# Patient Record
Sex: Female | Born: 1960 | Race: White | Hispanic: No | Marital: Married | Smoking: Current every day smoker
Health system: Southern US, Community
[De-identification: ages and names within clinical notes are randomized; demographics above are authoritative.]

## PROBLEM LIST (undated history)

## (undated) DIAGNOSIS — F329 Major depressive disorder, single episode, unspecified: Secondary | ICD-10-CM

## (undated) DIAGNOSIS — E079 Disorder of thyroid, unspecified: Secondary | ICD-10-CM

## (undated) DIAGNOSIS — F419 Anxiety disorder, unspecified: Secondary | ICD-10-CM

## (undated) DIAGNOSIS — M2679 Other specified alveolar anomalies: Secondary | ICD-10-CM

## (undated) DIAGNOSIS — F32A Depression, unspecified: Secondary | ICD-10-CM

## (undated) HISTORY — PX: TONSILLECTOMY: SUR1361

## (undated) HISTORY — PX: REPLACEMENT TOTAL KNEE: SUR1224

## (undated) HISTORY — PX: ABDOMINAL HYSTERECTOMY: SHX81

---

## 2004-11-21 ENCOUNTER — Emergency Department: Payer: Self-pay | Admitting: Emergency Medicine

## 2004-12-04 ENCOUNTER — Ambulatory Visit: Payer: Self-pay | Admitting: Internal Medicine

## 2007-05-20 ENCOUNTER — Other Ambulatory Visit: Payer: Self-pay

## 2007-05-20 ENCOUNTER — Inpatient Hospital Stay: Payer: Self-pay | Admitting: Internal Medicine

## 2007-05-21 ENCOUNTER — Other Ambulatory Visit: Payer: Self-pay

## 2010-10-14 ENCOUNTER — Inpatient Hospital Stay: Payer: Self-pay | Admitting: Unknown Physician Specialty

## 2014-07-22 ENCOUNTER — Emergency Department: Payer: Self-pay | Admitting: Emergency Medicine

## 2016-07-17 ENCOUNTER — Ambulatory Visit: Payer: 59 | Admitting: Physical Therapy

## 2016-11-01 ENCOUNTER — Emergency Department
Admission: EM | Admit: 2016-11-01 | Discharge: 2016-11-01 | Disposition: A | Discharge status: Home / Self Care | Payer: BLUE CROSS/BLUE SHIELD | Attending: Emergency Medicine | Admitting: Emergency Medicine

## 2016-11-01 ENCOUNTER — Encounter: Payer: Self-pay | Admitting: Emergency Medicine

## 2016-11-01 DIAGNOSIS — K0889 Other specified disorders of teeth and supporting structures: Secondary | ICD-10-CM | POA: Insufficient documentation

## 2016-11-01 DIAGNOSIS — R6884 Jaw pain: Secondary | ICD-10-CM

## 2016-11-01 DIAGNOSIS — R001 Bradycardia, unspecified: Secondary | ICD-10-CM | POA: Insufficient documentation

## 2016-11-01 DIAGNOSIS — M542 Cervicalgia: Secondary | ICD-10-CM | POA: Diagnosis not present

## 2016-11-01 HISTORY — DX: Depression, unspecified: F32.A

## 2016-11-01 HISTORY — DX: Major depressive disorder, single episode, unspecified: F32.9

## 2016-11-01 HISTORY — DX: Other specified alveolar anomalies: M26.79

## 2016-11-01 HISTORY — DX: Disorder of thyroid, unspecified: E07.9

## 2016-11-01 LAB — BASIC METABOLIC PANEL
Anion gap: 6 (ref 5–15)
BUN: 7 mg/dL (ref 6–20)
CHLORIDE: 105 mmol/L (ref 101–111)
CO2: 30 mmol/L (ref 22–32)
Calcium: 8.9 mg/dL (ref 8.9–10.3)
Creatinine, Ser: 0.67 mg/dL (ref 0.44–1.00)
GFR calc Af Amer: >60 mL/min (ref >60)
GFR calc non Af Amer: >60 mL/min (ref >60)
GLUCOSE: 111 mg/dL — AB (ref 65–99)
POTASSIUM: 3.8 mmol/L (ref 3.5–5.1)
Sodium: 141 mmol/L (ref 135–145)

## 2016-11-01 LAB — CBC
HEMATOCRIT: 43 % (ref 35.0–47.0)
HEMOGLOBIN: 14.6 g/dL (ref 12.0–16.0)
MCH: 30.2 pg (ref 26.0–34.0)
MCHC: 33.9 g/dL (ref 32.0–36.0)
MCV: 89 fL (ref 80.0–100.0)
Platelets: 236 10*3/uL (ref 150–440)
RBC: 4.83 MIL/uL (ref 3.80–5.20)
RDW: 14.3 % (ref 11.5–14.5)
WBC: 6.9 10*3/uL (ref 3.6–11.0)

## 2016-11-01 LAB — TROPONIN I

## 2016-11-01 MED ORDER — KETOROLAC TROMETHAMINE 10 MG PO TABS: 10.0000 mg | ORAL_TABLET | Freq: Three times a day (TID) | ORAL | 0 refills | Status: DC | PRN

## 2016-11-01 MED ORDER — PENICILLIN V POTASSIUM 500 MG PO TABS: 500.0000 mg | ORAL_TABLET | Freq: Four times a day (QID) | ORAL | 0 refills | Status: DC

## 2016-11-01 MED ORDER — ONDANSETRON 4 MG PO TBDP
4.0000 mg | ORAL_TABLET | Freq: Once | ORAL | Status: AC
  Administered 2016-11-01: 4 mg via ORAL
  Filled 2016-11-01: qty 1

## 2016-11-01 MED ORDER — KETOROLAC TROMETHAMINE 60 MG/2ML IM SOLN
60.0000 mg | Freq: Once | INTRAMUSCULAR | Status: AC
  Administered 2016-11-01: 60 mg via INTRAMUSCULAR
  Filled 2016-11-01: qty 2

## 2016-11-01 MED ORDER — OXYCODONE-ACETAMINOPHEN 5-325 MG PO TABS: 1.0000 | ORAL_TABLET | ORAL | 0 refills | Status: AC | PRN

## 2016-11-01 MED ORDER — PENICILLIN V POTASSIUM 250 MG PO TABS
500.0000 mg | ORAL_TABLET | Freq: Once | ORAL | Status: AC
  Administered 2016-11-01: 500 mg via ORAL
  Filled 2016-11-01: qty 2

## 2016-11-01 MED ORDER — HYDROMORPHONE HCL 1 MG/ML IJ SOLN
1.5000 mg | Freq: Once | INTRAMUSCULAR | Status: AC
  Administered 2016-11-01: 1.5 mg via INTRAMUSCULAR
  Filled 2016-11-01: qty 2

## 2016-11-01 NOTE — ED Provider Notes (Addendum)
[facility] Emergency Department Provider Note  ____________________________________________  Time seen: Approximately 12:23 PM  I have reviewed the triage vital signs and the nursing notes.   HISTORY  Chief Complaint Jaw Pain; Neck Pain; and Headache    HPI Molly Sutton is a 55 y.o. female w/ a hx of chronic mandibular tori (dental exostosis, bony overgrowth) presenting with left lower jaw pain. She reports that 3 days ago she began to have pain in the left lower jaw, which has been getting progressively worse. She now has associated left lateral neck and posterior scalp pain. She has pain with mastication and has had decreased by mouth intake. No improvement with hydrocodone; she did not try Tylenol or Motrin. She has not had any fever, chills, shortness of breath, drooling. No sore throat or ear pain. This does not feel similar to her underlying tori condition.  No severe headache, neck stiffness, trauma, tick bite.   Past Medical History:  Diagnosis Date  . Depression   . Thyroid disease   . 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   Past Surgical History:  Procedure Laterality Date  . ABDOMINAL HYSTERECTOMY    . REPLACEMENT TOTAL KNEE    . TONSILLECTOMY        Allergies Patient has no known allergies.  No family history on file.  Social History Social History  Substance Use Topics  . Smoking status: Not on file  . Smokeless tobacco: Not on file  . Alcohol use Not on file    Review of Systems Constitutional: No fever/chills.No lightheadedness or syncope. No generalized malaise. Eyes: No visual changes. ENT: No sore throat. No congestion or rhinorrhea. Positive pain in the left lower jaw. No trismus. No drooling. Cardiovascular: Denies chest pain. Denies palpitations. Respiratory: Denies shortness of breath.  No cough. Gastrointestinal: No abdominal pain.  No  nausea, no vomiting.  No diarrhea.  No constipation. Genitourinary: Negative for dysuria. Musculoskeletal: Negative for back pain. Positive left neck pain and posterior scalp pain. Skin: Negative for rash. Neurological: Negative for headaches. No focal numbness, tingling or weakness.   10-point ROS otherwise negative.  ____________________________________________   PHYSICAL EXAM:  VITAL SIGNS: ED Triage Vitals [11/01/16 1049]  Enc Vitals Group     BP (!) 144/60     Pulse Rate (!) 51     Resp 18     Temp 98.5 F (36.9 C)     Temp Source Oral     SpO2 96 %     Weight 230 lb (104.3 kg)     Height 5\' 6"  (1.676 m)     Head Circumference      Peak Flow      Pain Score 7     Pain Loc      Pain Edu?      Excl. in GC?     Constitutional: Alert and oriented. Uncomfortable appearing but in no acute distress. Answers questions appropriately. Eyes: Conjunctivae are normal.  EOMI. No scleral icterus. Head: Atraumatic. Nose: No congestion/rhinnorhea. Mouth/Throat: Mucous membranes are moist. No posterior pharyngeal erythema. No tonsillar swelling or exudate. The posterior palate is symmetric and uvula is midline. The patient does have some bony outgrowth that are well covered with tissue most prominent on the medial aspect of the lower jaw and also in the posterior right and left lateral aspect of the jaw. The patient has focal tenderness when I  palpate tooth #18 and 19 without any obvious dental abscess. No halitosis. No trismus. No drooling. No hoarse voice. Neck: No stridor.  Supple.  No JVD. No meningismus. Cardiovascular: Normal rate, regular rhythm. No murmurs, rubs or gallops.  Respiratory: Normal respiratory effort.  No accessory muscle use or retractions. Lungs CTAB.  No wheezes, rales or ronchi. Musculoskeletal: Moves all extremity's well. Neurologic:  A&Ox3.  Speech is clear.  Face and smile are symmetric.  EOMI.  Moves all extremities well. Skin:  Skin is warm, dry and intact.  No rash noted. Psychiatric: Mood and affect are normal. Speech and behavior are normal.  Normal judgement.  ____________________________________________   LABS (all labs ordered are listed, but only abnormal results are displayed)  Labs Reviewed  BASIC METABOLIC PANEL - Abnormal; Notable for the following:       Result Value   Glucose, Bld 111 (*)    All other components within normal limits  CBC  TROPONIN I  URINALYSIS COMPLETEWITH MICROSCOPIC ([facility] ONLY)   ____________________________________________  EKG  ED ECG REPORT I, Rockne MenghiniNorman, Anne-Caroline, the attending physician, personally viewed and interpreted this ECG.   Date: 11/01/2016  EKG Time: 1047  Rate: 51  Rhythm: sinus bradycardia  Axis: Normal  Intervals:none  ST&T Change: Nonspecific T-wave inversion in V1. No ST elevation.  ____________________________________________  RADIOLOGY  No results found.  ____________________________________________   PROCEDURES  Procedure(s) performed: None  Procedures  Critical Care performed: No ____________________________________________   INITIAL IMPRESSION / ASSESSMENT AND PLAN / ED COURSE  Pertinent labs & imaging results that were available during my care of the patient were reviewed by me and considered in my medical decision making (see chart for details).  [age] y.o. female with chronic tori presenting with acute left-sided low jaw pain and physical exam findings of tenderness over her bottom left posterior teeth. The patient does not have any signs of impending airway compromise, nor does she have signs or symptoms consistent with meningitis. I do not see any evidence of abscess, and progressive soft tissue infection would be very unlikely. It is possible the patient has a dental abscess or dental caries which will require a dentist evaluation for treatment. Here, I will initiate the patient on penicillin, and treat her symptomatically. She understands return  precautions as well as follow-up instructions per  ____________________________________________  FINAL CLINICAL IMPRESSION(S) / ED DIAGNOSES  Final diagnoses:  None    Clinical Course       NEW MEDICATIONS STARTED DURING THIS VISIT:  New Prescriptions   No medications on file      Rockne MenghiniAnne-Caroline Morrie Daywalt, MD 11/01/16 1230    Rockne MenghiniAnne-Caroline Pa Tennant, MD 11/01/16 1239

## 2016-11-01 NOTE — ED Triage Notes (Signed)
Pt presents to ED with reports of intense jaw, neck and head pain that began yesterday. Pt states she has a history of tori in her jaw. Pt states she took a vicodin without relief. Pt alert and oriented in triage. Bilateral strong hand grips, facial symmetry, speech clear.

## 2016-11-01 NOTE — Discharge Instructions (Signed)
Please make an appointment to be evaluated by your dentist tomorrow. Please start the penicillin today, and take the entire course, even if you're feeling better. You may take Tylenol or Toradol for mild to moderate pain. Percocet is for severe pain. Do not drive within 8 hours of taking Percocet.  Return to the emergency department if you develop shortness of breath, drooling, fever, severe headache, changes in mental status, inability to drink fluids, significant facial swelling, or any other symptoms concerning to you.

## 2016-11-10 ENCOUNTER — Other Ambulatory Visit: Payer: Self-pay | Admitting: Internal Medicine

## 2016-11-10 DIAGNOSIS — Z1231 Encounter for screening mammogram for malignant neoplasm of breast: Secondary | ICD-10-CM

## 2016-12-16 ENCOUNTER — Ambulatory Visit: Payer: 59

## 2016-12-31 ENCOUNTER — Ambulatory Visit: Payer: 59 | Attending: Internal Medicine

## 2018-06-22 DIAGNOSIS — E7849 Other hyperlipidemia: Secondary | ICD-10-CM | POA: Diagnosis not present

## 2018-06-22 DIAGNOSIS — E538 Deficiency of other specified B group vitamins: Secondary | ICD-10-CM | POA: Diagnosis not present

## 2018-06-29 DIAGNOSIS — Z0000 Encounter for general adult medical examination without abnormal findings: Secondary | ICD-10-CM | POA: Diagnosis not present

## 2018-06-29 DIAGNOSIS — E079 Disorder of thyroid, unspecified: Secondary | ICD-10-CM | POA: Diagnosis not present

## 2018-06-29 DIAGNOSIS — E538 Deficiency of other specified B group vitamins: Secondary | ICD-10-CM | POA: Diagnosis not present

## 2018-06-29 DIAGNOSIS — M79645 Pain in left finger(s): Secondary | ICD-10-CM | POA: Diagnosis not present

## 2018-07-01 ENCOUNTER — Telehealth: Payer: Self-pay | Admitting: *Deleted

## 2018-07-01 DIAGNOSIS — Z122 Encounter for screening for malignant neoplasm of respiratory organs: Secondary | ICD-10-CM

## 2018-07-01 DIAGNOSIS — Z87891 Personal history of nicotine dependence: Secondary | ICD-10-CM

## 2018-07-01 NOTE — Telephone Encounter (Signed)
Received referral for initial lung cancer screening scan. Contacted patient and obtained smoking history,(current, 30 pack year) as well as answering questions related to screening process. Patient denies signs of lung cancer such as weight loss or hemoptysis. Patient denies comorbidity that would prevent curative treatment if lung cancer were found. Patient is scheduled for shared decision making visit and CT scan on 07/13/18.

## 2018-07-12 ENCOUNTER — Encounter: Payer: Self-pay | Admitting: Oncology

## 2018-07-13 ENCOUNTER — Inpatient Hospital Stay: Payer: Commercial Managed Care - PPO | Attending: Oncology | Admitting: Oncology

## 2018-07-13 ENCOUNTER — Encounter (INDEPENDENT_AMBULATORY_CARE_PROVIDER_SITE_OTHER): Payer: Self-pay

## 2018-07-13 ENCOUNTER — Ambulatory Visit
Admission: RE | Admit: 2018-07-13 | Discharge: 2018-07-13 | Disposition: A | Discharge status: Home / Self Care | Payer: Commercial Managed Care - PPO | Source: Ambulatory Visit | Attending: Oncology | Admitting: Oncology

## 2018-07-13 DIAGNOSIS — Z122 Encounter for screening for malignant neoplasm of respiratory organs: Secondary | ICD-10-CM

## 2018-07-13 DIAGNOSIS — I700 Atherosclerosis of aorta: Secondary | ICD-10-CM | POA: Insufficient documentation

## 2018-07-13 DIAGNOSIS — I2510 Atherosclerotic heart disease of native coronary artery without angina pectoris: Secondary | ICD-10-CM | POA: Diagnosis not present

## 2018-07-13 DIAGNOSIS — Z87891 Personal history of nicotine dependence: Secondary | ICD-10-CM

## 2018-07-13 NOTE — Progress Notes (Signed)
In accordance with CMS guidelines, patient has met eligibility criteria including age, absence of signs or symptoms of lung cancer.  Social History   Tobacco Use  . Smoking status: Current Every Day Smoker    Packs/day: 1.00    Years: 30.00    Pack years: 30.00    Types: Cigarettes  Substance Use Topics  . Alcohol use: Not on file  . Drug use: Not on file     A shared decision-making session was conducted prior to the performance of CT scan. This includes one or more decision aids, includes benefits and harms of screening, follow-up diagnostic testing, over-diagnosis, false positive rate, and total radiation exposure.  Counseling on the importance of adherence to annual lung cancer LDCT screening, impact of co-morbidities, and ability or willingness to undergo diagnosis and treatment is imperative for compliance of the program.  Counseling on the importance of continued smoking cessation for former smokers; the importance of smoking cessation for current smokers, and information about tobacco cessation interventions have been given to patient including [place] and [facility] quit [place] programs.  Written order for lung cancer screening with LDCT has been given to the patient and any and all questions have been answered to the best of my abilities.   Yearly follow up will be coordinated by Burgess Estelle, Thoracic Navigator.  Faythe Casa, NP 07/13/2018 11:13 AM

## 2018-07-14 ENCOUNTER — Encounter: Payer: Self-pay | Admitting: *Deleted

## 2019-01-11 DIAGNOSIS — R600 Localized edema: Secondary | ICD-10-CM | POA: Diagnosis not present

## 2019-01-11 DIAGNOSIS — E079 Disorder of thyroid, unspecified: Secondary | ICD-10-CM | POA: Diagnosis not present

## 2019-01-11 DIAGNOSIS — E7849 Other hyperlipidemia: Secondary | ICD-10-CM | POA: Diagnosis not present

## 2019-01-19 DIAGNOSIS — I2510 Atherosclerotic heart disease of native coronary artery without angina pectoris: Secondary | ICD-10-CM | POA: Diagnosis not present

## 2019-01-24 DIAGNOSIS — E7849 Other hyperlipidemia: Secondary | ICD-10-CM | POA: Diagnosis not present

## 2019-01-24 DIAGNOSIS — I2510 Atherosclerotic heart disease of native coronary artery without angina pectoris: Secondary | ICD-10-CM | POA: Diagnosis not present

## 2019-01-24 DIAGNOSIS — I200 Unstable angina: Secondary | ICD-10-CM | POA: Diagnosis not present

## 2019-01-26 ENCOUNTER — Encounter
Admission: RE | Disposition: A | Discharge status: Home / Self Care | Payer: Self-pay | Source: Home / Self Care | Attending: Internal Medicine

## 2019-01-26 ENCOUNTER — Encounter: Payer: Self-pay | Admitting: *Deleted

## 2019-01-26 ENCOUNTER — Other Ambulatory Visit: Payer: Self-pay

## 2019-01-26 ENCOUNTER — Ambulatory Visit
Admission: RE | Admit: 2019-01-26 | Discharge: 2019-01-26 | Disposition: A | Discharge status: Home / Self Care | Payer: Commercial Managed Care - PPO | Attending: Internal Medicine | Admitting: Internal Medicine

## 2019-01-26 DIAGNOSIS — Z6837 Body mass index (BMI) 37.0-37.9, adult: Secondary | ICD-10-CM | POA: Insufficient documentation

## 2019-01-26 DIAGNOSIS — E669 Obesity, unspecified: Secondary | ICD-10-CM | POA: Diagnosis not present

## 2019-01-26 DIAGNOSIS — R609 Edema, unspecified: Secondary | ICD-10-CM | POA: Insufficient documentation

## 2019-01-26 DIAGNOSIS — R079 Chest pain, unspecified: Secondary | ICD-10-CM | POA: Insufficient documentation

## 2019-01-26 DIAGNOSIS — Z79899 Other long term (current) drug therapy: Secondary | ICD-10-CM | POA: Diagnosis not present

## 2019-01-26 DIAGNOSIS — Z79890 Hormone replacement therapy (postmenopausal): Secondary | ICD-10-CM | POA: Insufficient documentation

## 2019-01-26 DIAGNOSIS — R9439 Abnormal result of other cardiovascular function study: Secondary | ICD-10-CM | POA: Insufficient documentation

## 2019-01-26 DIAGNOSIS — Z90710 Acquired absence of both cervix and uterus: Secondary | ICD-10-CM | POA: Insufficient documentation

## 2019-01-26 DIAGNOSIS — F17210 Nicotine dependence, cigarettes, uncomplicated: Secondary | ICD-10-CM | POA: Insufficient documentation

## 2019-01-26 DIAGNOSIS — M1990 Unspecified osteoarthritis, unspecified site: Secondary | ICD-10-CM | POA: Insufficient documentation

## 2019-01-26 DIAGNOSIS — J449 Chronic obstructive pulmonary disease, unspecified: Secondary | ICD-10-CM | POA: Insufficient documentation

## 2019-01-26 DIAGNOSIS — Z7982 Long term (current) use of aspirin: Secondary | ICD-10-CM | POA: Diagnosis not present

## 2019-01-26 DIAGNOSIS — R9430 Abnormal result of cardiovascular function study, unspecified: Secondary | ICD-10-CM | POA: Diagnosis not present

## 2019-01-26 DIAGNOSIS — I208 Other forms of angina pectoris: Secondary | ICD-10-CM | POA: Diagnosis not present

## 2019-01-26 DIAGNOSIS — Z8249 Family history of ischemic heart disease and other diseases of the circulatory system: Secondary | ICD-10-CM | POA: Diagnosis not present

## 2019-01-26 DIAGNOSIS — E7849 Other hyperlipidemia: Secondary | ICD-10-CM | POA: Diagnosis not present

## 2019-01-26 DIAGNOSIS — E079 Disorder of thyroid, unspecified: Secondary | ICD-10-CM | POA: Diagnosis not present

## 2019-01-26 HISTORY — PX: LEFT HEART CATH AND CORONARY ANGIOGRAPHY: CATH118249

## 2019-01-26 HISTORY — DX: Anxiety disorder, unspecified: F41.9

## 2019-01-26 SURGERY — LEFT HEART CATH AND CORONARY ANGIOGRAPHY
Anesthesia: Moderate Sedation | Laterality: Left

## 2019-01-26 MED ORDER — HEPARIN (PORCINE) IN NACL 1000-0.9 UT/500ML-% IV SOLN
INTRAVENOUS | Status: AC
  Filled 2019-01-26: qty 1000

## 2019-01-26 MED ORDER — FENTANYL CITRATE (PF) 100 MCG/2ML IJ SOLN
INTRAMUSCULAR | Status: AC
  Filled 2019-01-26: qty 2

## 2019-01-26 MED ORDER — SODIUM CHLORIDE 0.9% FLUSH: 3.0000 mL | Freq: Two times a day (BID) | INTRAVENOUS | Status: DC

## 2019-01-26 MED ORDER — ONDANSETRON HCL 4 MG/2ML IJ SOLN: 4.0000 mg | Freq: Four times a day (QID) | INTRAMUSCULAR | Status: DC | PRN

## 2019-01-26 MED ORDER — HEPARIN SODIUM (PORCINE) 1000 UNIT/ML IJ SOLN
INTRAMUSCULAR | Status: DC | PRN
  Administered 2019-01-26: 5300 [IU] via INTRAVENOUS

## 2019-01-26 MED ORDER — ACETAMINOPHEN 325 MG PO TABS: 650.0000 mg | ORAL_TABLET | ORAL | Status: DC | PRN

## 2019-01-26 MED ORDER — SODIUM CHLORIDE 0.9 % WEIGHT BASED INFUSION: 1.0000 mL/kg/h | INTRAVENOUS | Status: DC

## 2019-01-26 MED ORDER — SODIUM CHLORIDE 0.9 % IV SOLN: 250.0000 mL | INTRAVENOUS | Status: DC | PRN

## 2019-01-26 MED ORDER — ASPIRIN 81 MG PO CHEW: 81.0000 mg | CHEWABLE_TABLET | ORAL | Status: DC

## 2019-01-26 MED ORDER — ACETAMINOPHEN 500 MG PO TABS
ORAL_TABLET | ORAL | Status: AC
  Filled 2019-01-26: qty 2

## 2019-01-26 MED ORDER — MIDAZOLAM HCL 2 MG/2ML IJ SOLN
INTRAMUSCULAR | Status: AC
  Filled 2019-01-26: qty 2

## 2019-01-26 MED ORDER — VERAPAMIL HCL 2.5 MG/ML IV SOLN
INTRAVENOUS | Status: AC
  Filled 2019-01-26: qty 2

## 2019-01-26 MED ORDER — VERAPAMIL HCL 2.5 MG/ML IV SOLN
INTRAVENOUS | Status: DC | PRN
  Administered 2019-01-26: 2.5 mg via INTRA_ARTERIAL

## 2019-01-26 MED ORDER — SODIUM CHLORIDE 0.9 % WEIGHT BASED INFUSION
3.0000 mL/kg/h | INTRAVENOUS | Status: AC
  Administered 2019-01-26: 3 mL/kg/h via INTRAVENOUS

## 2019-01-26 MED ORDER — SODIUM CHLORIDE 0.9% FLUSH: 3.0000 mL | INTRAVENOUS | Status: DC | PRN

## 2019-01-26 MED ORDER — MIDAZOLAM HCL 2 MG/2ML IJ SOLN
INTRAMUSCULAR | Status: DC | PRN
  Administered 2019-01-26: 1 mg via INTRAVENOUS

## 2019-01-26 MED ORDER — HEPARIN SODIUM (PORCINE) 1000 UNIT/ML IJ SOLN
INTRAMUSCULAR | Status: AC
  Filled 2019-01-26: qty 1

## 2019-01-26 MED ORDER — FENTANYL CITRATE (PF) 100 MCG/2ML IJ SOLN
INTRAMUSCULAR | Status: DC | PRN
  Administered 2019-01-26: 50 ug via INTRAVENOUS

## 2019-01-26 MED ORDER — IOPAMIDOL (ISOVUE-300) INJECTION 61%
INTRAVENOUS | Status: DC | PRN
  Administered 2019-01-26: 40 mL via INTRA_ARTERIAL

## 2019-01-26 MED ORDER — ACETAMINOPHEN 500 MG PO TABS
1000.0000 mg | ORAL_TABLET | Freq: Once | ORAL | Status: AC
  Administered 2019-01-26: 1000 mg via ORAL

## 2019-01-26 SURGICAL SUPPLY — 10 items
CATH INFINITI 5 FR JL3.5 (CATHETERS) ×2 IMPLANT
CATH INFINITI 5FR ANG PIGTAIL (CATHETERS) ×2 IMPLANT
CATH INFINITI JR4 5F (CATHETERS) ×2 IMPLANT
DEVICE RAD TR BAND REGULAR (VASCULAR PRODUCTS) ×2 IMPLANT
GLIDESHEATH SLEND SS 6F .021 (SHEATH) ×2 IMPLANT
KIT MANI 3VAL PERCEP (MISCELLANEOUS) ×2 IMPLANT
PACK CARDIAC CATH (CUSTOM PROCEDURE TRAY) ×2 IMPLANT
SHEATH AVANTI 5FR X 11CM (SHEATH) IMPLANT
WIRE GUIDERIGHT .035X150 (WIRE) IMPLANT
WIRE ROSEN-J .035X260CM (WIRE) ×2 IMPLANT

## 2019-01-26 NOTE — Discharge Instructions (Signed)
Radial Site Care Refer to this sheet in the next few weeks. These instructions provide you with information about caring for yourself after your procedure. Your health care provider may also give you more specific instructions. Your treatment has been planned according to current medical practices, but problems sometimes occur. Call your health care provider if you have any problems or questions after your procedure. What can I expect after the procedure? After your procedure, it is typical to have the following:  Bruising at the radial site that usually fades within 1-2 weeks.  Blood collecting in the tissue (hematoma) that may be painful to the touch. It should usually decrease in size and tenderness within 1-2 weeks.  Follow these instructions at home:  Take medicines only as directed by your health care provider. If you are on a medication called Metformin please do not take for 48 hours after your procedure.  Over the next 48hrs please increase your fluid intake of water and non caffeine beverages to flush the contrast dye out of your system.   You may shower 24 hours after the procedure  Leave your bandage on and gently wash the site with plain soap and water. Pat the area dry with a clean towel. Do not rub the site, because this may cause bleeding.  Remove your dressing 48hrs after your procedure and leave open to air.   Do not submerge your site in water for 7 days. This includes swimming and washing dishes.   Check your insertion site every day for redness, swelling, or drainage.  Do not apply powder or lotion to the site.  Do not flex or bend the affected arm for 24 hours or as directed by your health care provider.  Do not push or pull heavy objects with the affected arm for 24 hours or as directed by your health care provider.  Do not lift over 10 lb (4.5 kg) for 5 days after your procedure or as directed by your health care provider.  Ask your health care provider when it is  okay to: ? Return to work or school. ? Resume usual physical activities or sports. ? Resume sexual activity.  Do not drive home if you are discharged the same day as the procedure. Have someone else drive you.  You may drive 48 hours after the procedure Do not operate machinery or power tools for 24 hours after the procedure.  If your procedure was done as an outpatient procedure, which means that you went home the same day as your procedure, a responsible adult should be with you for the first 24 hours after you arrive home.  Keep all follow-up visits as directed by your health care provider. This is important. Contact a health care provider if:  You have a fever.  You have chills.  You have increased bleeding from the radial site. Hold pressure on the site. Get help right away if:  You have unusual pain at the radial site.  You have redness, warmth, or swelling at the radial site.  You have drainage (other than a small amount of blood on the dressing) from the radial site.  The radial site is bleeding, and the bleeding does not stop after 15 minutes of holding steady pressure on the site.  Your arm or hand becomes pale, cool, tingly, or numb. This information is not intended to replace advice given to you by your health care provider. Make sure you discuss any questions you have with your health care provider.  Document Released: 01/16/2011 Document Revised: 05/21/2016 Document Reviewed: 07/02/2014 Elsevier Interactive Patient Education  2018 [employer]    Angiogram, Care After This sheet gives you information about how to care for yourself after your procedure. Your health care provider may also give you more specific instructions. If you have problems or questions, contact your health care provider. What can I expect after the procedure? After the procedure, it is common to have bruising and tenderness at the catheter insertion area. Follow these instructions at  home: Insertion site care  Follow instructions from your health care provider about how to take care of your insertion site. Make sure you: ? Wash your hands with soap and water before you change your bandage (dressing). If soap and water are not available, use hand sanitizer. ? Change your dressing as told by your health care provider. ? Leave stitches (sutures), skin glue, or adhesive strips in place. These skin closures may need to stay in place for 2 weeks or longer. If adhesive strip edges start to loosen and curl up, you may trim the loose edges. Do not remove adhesive strips completely unless your health care provider tells you to do that.  Do not take baths, swim, or use a hot tub until your health care provider approves.  You may shower 24-48 hours after the procedure or as told by your health care provider. ? Gently wash the site with plain soap and water. ? Pat the area dry with a clean towel. ? Do not rub the site. This may cause bleeding.  Do not apply powder or lotion to the site. Keep the site clean and dry.  Check your insertion site every day for signs of infection. Check for: ? Redness, swelling, or pain. ? Fluid or blood. ? Warmth. ? Pus or a bad smell. Activity  Rest as told by your health care provider, usually for 1-2 days.  Do not lift anything that is heavier than 10 lbs. (4.5 kg) or as told by your health care provider.  Do not drive for 24 hours if you were given a medicine to help you relax (sedative).  Do not drive or use heavy machinery while taking prescription pain medicine. General instructions   Return to your normal activities as told by your health care provider, usually in about a week. Ask your health care provider what activities are safe for you.  If the catheter site starts bleeding, lie flat and put pressure on the site. If the bleeding does not stop, get help right away. This is a medical emergency.  Drink enough fluid to keep your urine  clear or pale yellow. This helps flush the contrast dye from your body.  Take over-the-counter and prescription medicines only as told by your health care provider.  Keep all follow-up visits as told by your health care provider. This is important. Contact a health care provider if:  You have a fever or chills.  You have redness, swelling, or pain around your insertion site.  You have fluid or blood coming from your insertion site.  The insertion site feels warm to the touch.  You have pus or a bad smell coming from your insertion site.  You have bruising around the insertion site.  You notice blood collecting in the tissue around the catheter site (hematoma). The hematoma may be painful to the touch. Get help right away if:  You have severe pain at the catheter insertion area.  The catheter insertion area swells very fast.  The catheter insertion area is bleeding, and the bleeding does not stop when you hold steady pressure on the area.  The area near or just beyond the catheter insertion site becomes pale, cool, tingly, or numb. These symptoms may represent a serious problem that is an emergency. Do not wait to see if the symptoms will go away. Get medical help right away. Call your local emergency services (911 in the [place] Do not drive yourself to the hospital. Summary  After the procedure, it is common to have bruising and tenderness at the catheter insertion area.  After the procedure, it is important to rest and drink plenty of fluids.  Do not take baths, swim, or use a hot tub until your health care provider says it is okay to do so. You may shower 24-48 hours after the procedure or as told by your health care provider.  If the catheter site starts bleeding, lie flat and put pressure on the site. If the bleeding does not stop, get help right away. This is a medical emergency. This information is not intended to replace advice given to you by your health care provider.  Make sure you discuss any questions you have with your health care provider. Document Released: 07/02/2005 Document Revised: 11/18/2016 Document Reviewed: 11/18/2016 Elsevier Interactive Patient Education  2019 [employer]

## 2019-01-27 ENCOUNTER — Encounter: Payer: Self-pay | Admitting: Internal Medicine

## 2019-02-07 DIAGNOSIS — E7849 Other hyperlipidemia: Secondary | ICD-10-CM | POA: Diagnosis not present

## 2019-02-07 DIAGNOSIS — I200 Unstable angina: Secondary | ICD-10-CM | POA: Diagnosis not present

## 2019-02-07 DIAGNOSIS — R197 Diarrhea, unspecified: Secondary | ICD-10-CM | POA: Diagnosis not present

## 2019-02-23 ENCOUNTER — Encounter: Payer: Self-pay | Admitting: *Deleted

## 2019-07-13 ENCOUNTER — Telehealth: Payer: Self-pay | Admitting: *Deleted

## 2019-07-13 NOTE — Telephone Encounter (Signed)
Patient has been notified that lung cancer screening CT scan is due currently or will be in near future. Confirmed that patient is within the appropriate age range, and asymptomatic, (no signs or symptoms of lung cancer). Patient denies illness that would prevent curative treatment for lung cancer if found. Verified smoking history (former smoker, quit sometime in the last year.). Patient is agreeable for CT scan being scheduled.

## 2019-07-14 ENCOUNTER — Other Ambulatory Visit: Payer: Self-pay | Admitting: *Deleted

## 2019-07-14 DIAGNOSIS — Z87891 Personal history of nicotine dependence: Secondary | ICD-10-CM

## 2019-07-14 DIAGNOSIS — Z122 Encounter for screening for malignant neoplasm of respiratory organs: Secondary | ICD-10-CM

## 2019-07-24 ENCOUNTER — Ambulatory Visit: Payer: Commercial Managed Care - PPO

## 2019-07-27 ENCOUNTER — Encounter: Payer: Self-pay | Admitting: *Deleted

## 2020-07-04 ENCOUNTER — Telehealth: Payer: Self-pay

## 2020-07-04 DIAGNOSIS — Z87891 Personal history of nicotine dependence: Secondary | ICD-10-CM

## 2020-07-04 DIAGNOSIS — Z122 Encounter for screening for malignant neoplasm of respiratory organs: Secondary | ICD-10-CM

## 2020-07-04 NOTE — Telephone Encounter (Signed)
Patient has been notified that the low dose lung cancer screening CT scan is due currently or will be in near future.  Confirmed that patient is within the appropriate age range and asymptomatic, (no signs or symptoms of lung cancer).  Patient denies illness that would prevent curative treatment for lung cancer if found.  Patient is agreeable for CT scan being scheduled.    Verified smoking history (current smoker, with 30 year 0.5-1 ppd history).   Now has [employer].  Does not have a card at this time and will call Shawn when she finds one.

## 2020-07-08 NOTE — Telephone Encounter (Signed)
Left voicemail in attempt to schedule lung screening appt.

## 2020-07-09 NOTE — Telephone Encounter (Signed)
Smoking history: current, 30.75 pack year, scheduled for CT scan. Obtained new insurance info: [employer] Member ID # [id number].

## 2020-07-09 NOTE — Addendum Note (Signed)
Addended by: Jonne Ply on: 07/09/2020 10:11 AM   Modules accepted: Orders

## 2020-07-17 ENCOUNTER — Ambulatory Visit
Admission: RE | Admit: 2020-07-17 | Discharge: 2020-07-17 | Disposition: A | Discharge status: Home / Self Care | Payer: Self-pay | Source: Ambulatory Visit | Attending: Oncology | Admitting: Oncology

## 2020-07-17 ENCOUNTER — Other Ambulatory Visit: Payer: Self-pay

## 2020-07-17 DIAGNOSIS — Z122 Encounter for screening for malignant neoplasm of respiratory organs: Secondary | ICD-10-CM | POA: Insufficient documentation

## 2020-07-17 DIAGNOSIS — Z87891 Personal history of nicotine dependence: Secondary | ICD-10-CM | POA: Insufficient documentation

## 2020-07-19 ENCOUNTER — Encounter: Payer: Self-pay | Admitting: *Deleted

## 2021-12-17 ENCOUNTER — Telehealth: Payer: Self-pay | Admitting: Acute Care

## 2021-12-17 NOTE — Telephone Encounter (Signed)
Spoke with pt regarding scheduling a f/u lung screening CT scan. Pt states she will never do a lung screening CT again due to her receiving a bill for the last one that was never resolved. Pt hung up before I could respond. Will send this to Dr Graciela Husbands as and Lorain Childes.

## 2022-03-19 DIAGNOSIS — M6281 Muscle weakness (generalized): Secondary | ICD-10-CM | POA: Diagnosis not present

## 2022-03-19 DIAGNOSIS — M1711 Unilateral primary osteoarthritis, right knee: Secondary | ICD-10-CM | POA: Diagnosis not present

## 2022-04-20 DIAGNOSIS — M6281 Muscle weakness (generalized): Secondary | ICD-10-CM | POA: Diagnosis not present

## 2022-04-20 DIAGNOSIS — M1711 Unilateral primary osteoarthritis, right knee: Secondary | ICD-10-CM | POA: Diagnosis not present

## 2022-05-11 IMAGING — CT CT CHEST LUNG CANCER SCREENING LOW DOSE W/O CM
2 of 5 series · 15 of 40 positions shown, 18 images · non-contrast
Comparison: 07/13/2018

CLINICAL DATA: 59-year-old female with 30 pack-year history of
smoking. Lung cancer screening.

EXAM:
CT CHEST WITHOUT CONTRAST LOW-DOSE FOR LUNG CANCER SCREENING
TECHNIQUE: Multidetector CT imaging of the chest was performed following the
standard protocol without IV contrast.

[Series 3: lung 1.00 · axial · 0.67mm/px · z∈[-1162,-882]mm · 12 of 310 slices shown, 15 images]
[im 15/310  mediastinal]
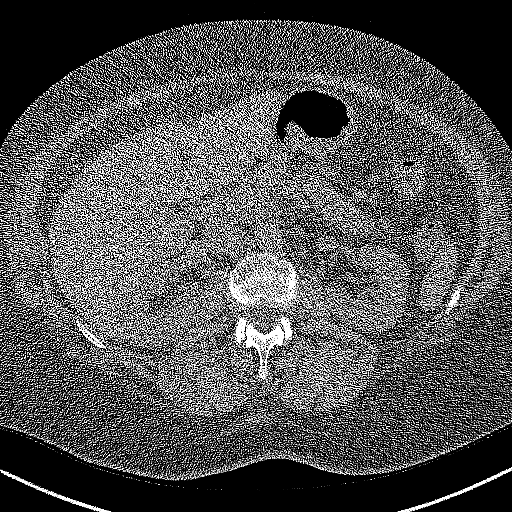
[im 15/310  lung]
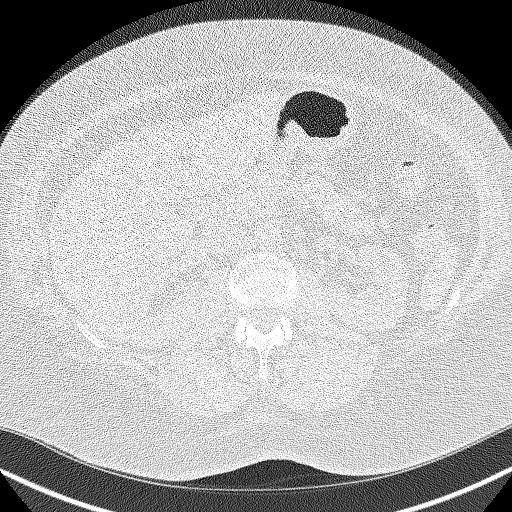
[im 43/310  lung]
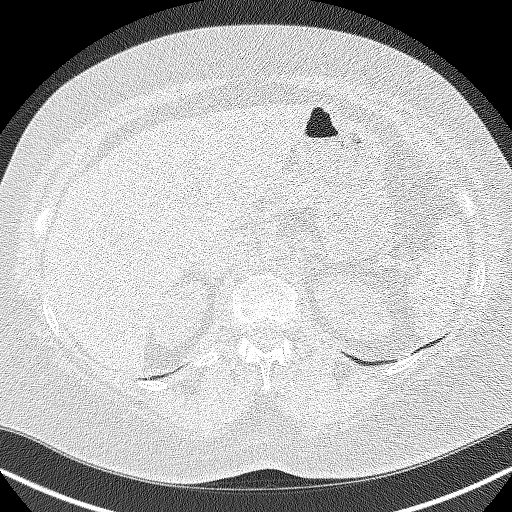
[im 71/310  lung]
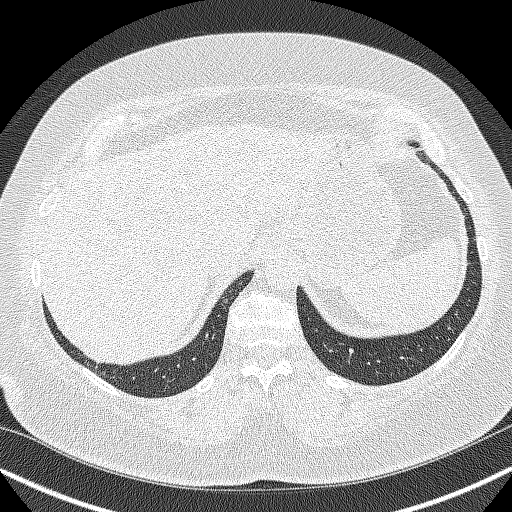
[im 99/310  lung]
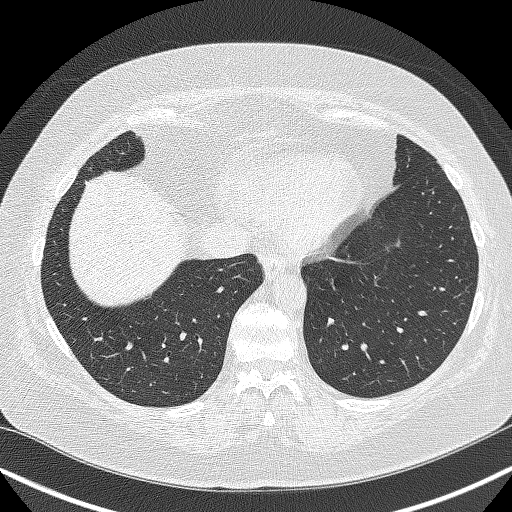
[im 113/310  mediastinal]
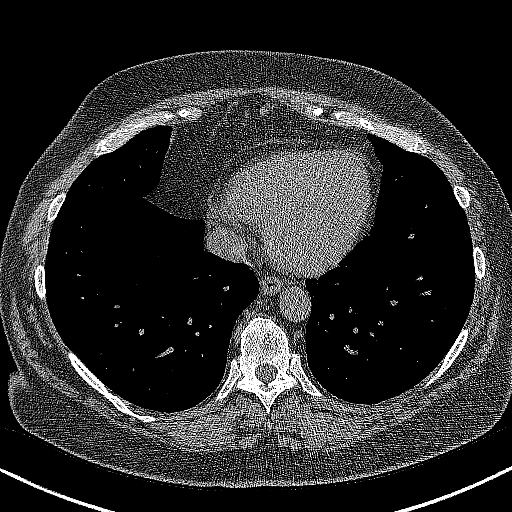
[im 113/310  lung]
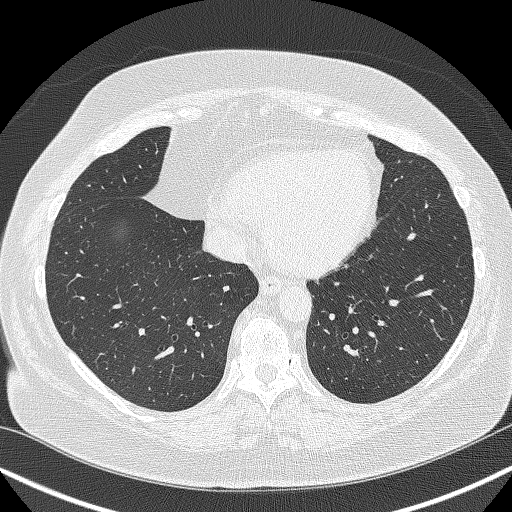
[im 141/310  lung]
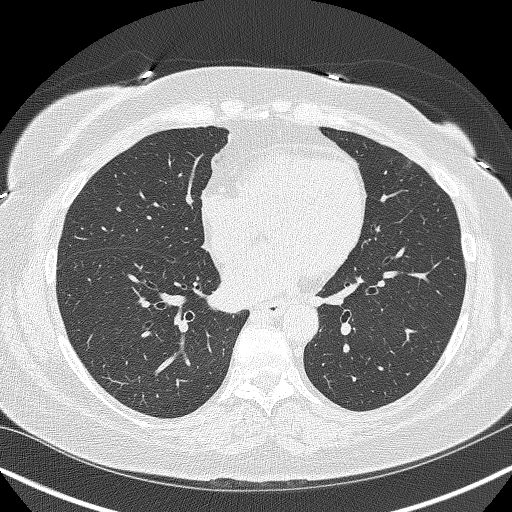
[im 169/310  lung]
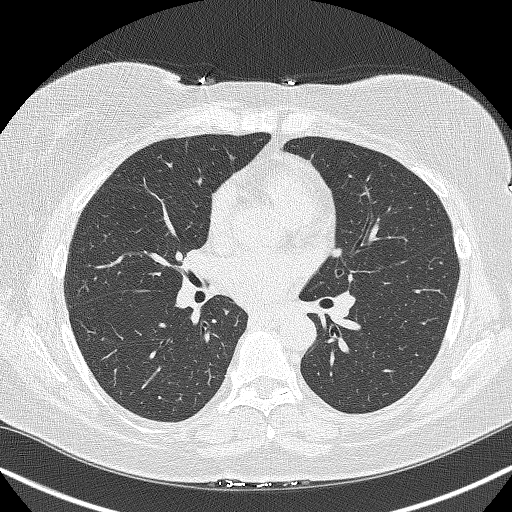
[im 197/310  lung]
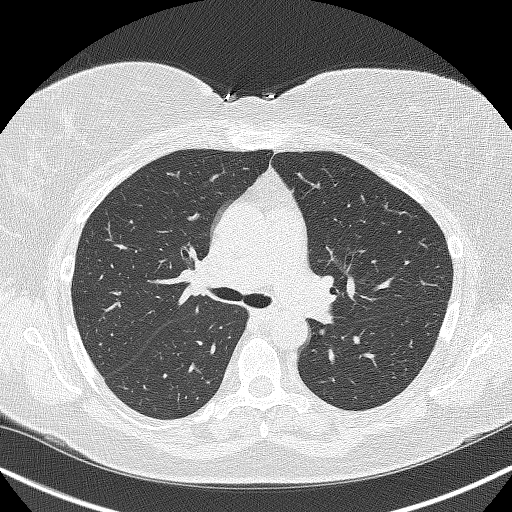
[im 211/310  mediastinal]
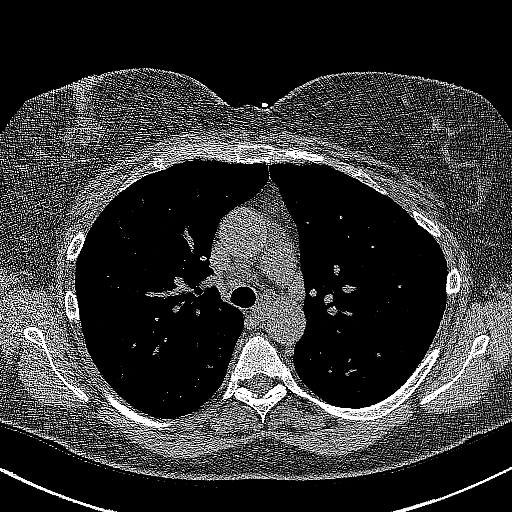
[im 211/310  lung]
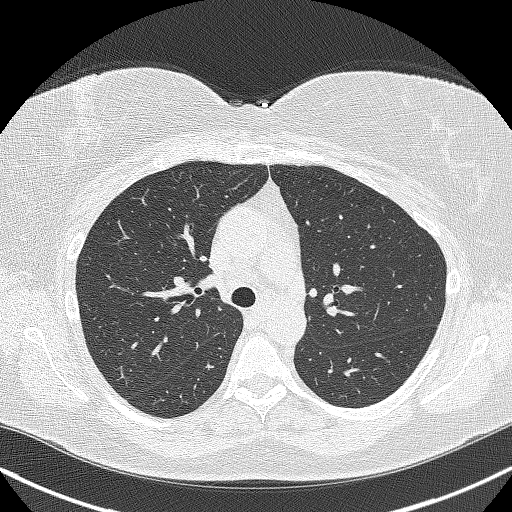
[im 239/310  lung]
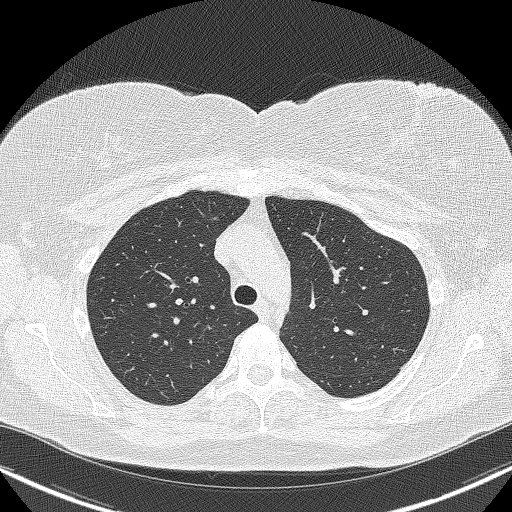
[im 267/310  lung]
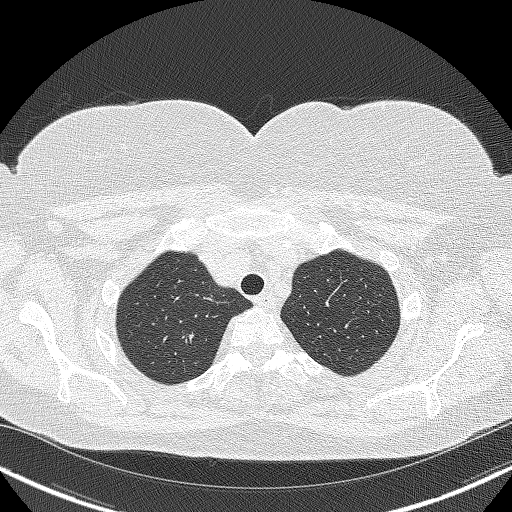
[im 295/310  lung]
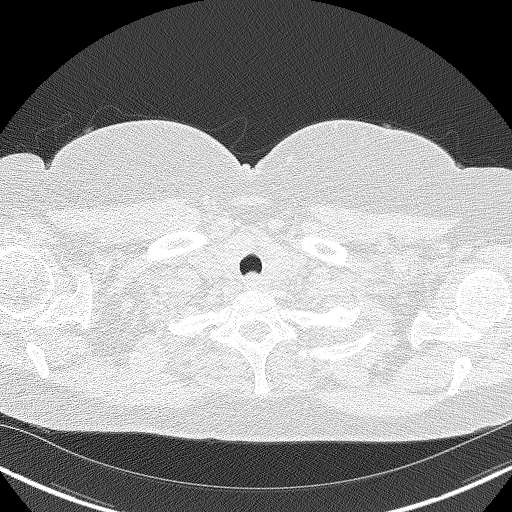

[Series 4: coronals lung 1.00 cor · coronal · 0.61mm/px · 3 of 290 slices shown]
[im 58/290  lung]
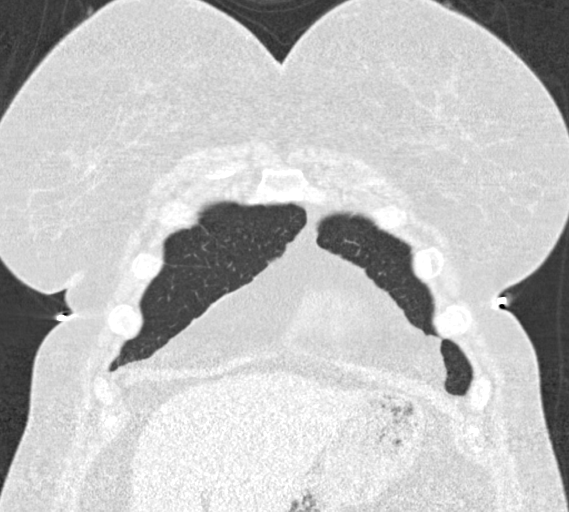
[im 116/290  lung]
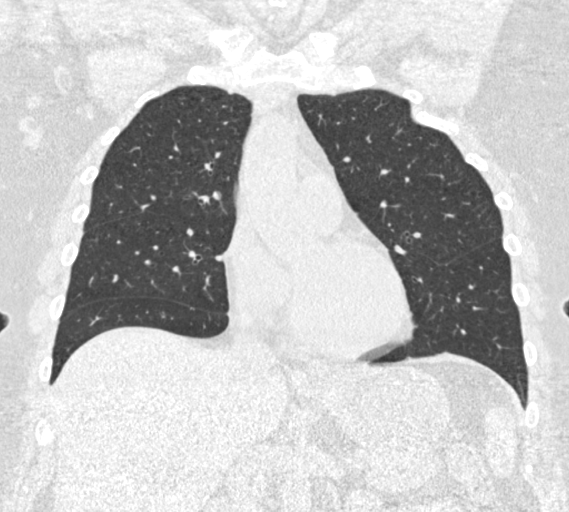
[im 174/290  lung]
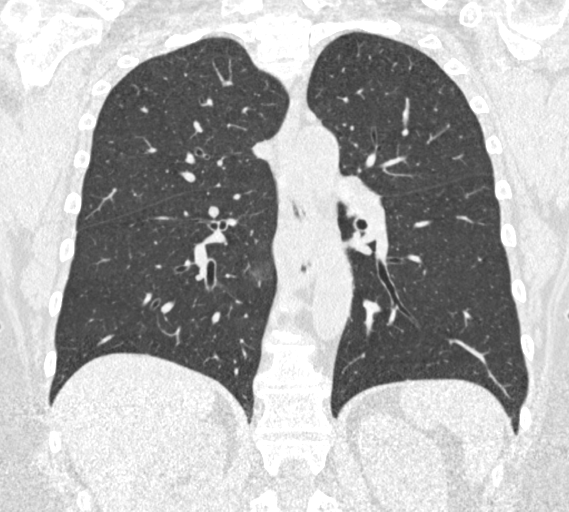

[15 of 40 positions shown; findings below may reference images not displayed]

FINDINGS: Cardiovascular: The heart size is normal. No substantial pericardial
effusion. Atherosclerotic calcification is noted in the wall of the
thoracic aorta.

Mediastinum/Nodes: No mediastinal lymphadenopathy. No evidence for
gross hilar lymphadenopathy although assessment is limited by the
lack of intravenous contrast on today's study. The esophagus has
normal imaging features. There is no axillary lymphadenopathy.

Lungs/Pleura: Tiny right-sided pulmonary nodules measure up to
maximum diameter of 2.5 mm. No suspicious pulmonary nodule or mass.
No focal airspace consolidation. No pleural effusion.

Upper Abdomen: 9 mm exophytic lesion upper pole left kidney is in
the location of a tiny subcapsular cyst seen on remote study from
8999.

Musculoskeletal: No worrisome lytic or sclerotic osseous
abnormality.
IMPRESSION: 1. Lung-RADS 2, benign appearance or behavior. Continue annual
screening with low-dose chest CT without contrast in 12 months.
2. Tiny exophytic lesion upper pole left kidney likely a cyst.
Attention on follow-up recommended.
3. Aortic Atherosclerosis ([id number]).

## 2022-07-28 ENCOUNTER — Telehealth: Payer: Self-pay | Admitting: *Deleted

## 2022-07-28 NOTE — Telephone Encounter (Signed)
Left message to call to schedule follow up LDCT scan.

## 2022-12-03 ENCOUNTER — Emergency Department: Payer: 59

## 2022-12-03 ENCOUNTER — Encounter: Payer: Self-pay | Admitting: Emergency Medicine

## 2022-12-03 ENCOUNTER — Other Ambulatory Visit: Payer: Self-pay

## 2022-12-03 ENCOUNTER — Emergency Department
Admission: EM | Admit: 2022-12-03 | Discharge: 2022-12-03 | Disposition: A | Discharge status: Home / Self Care | Payer: 59 | Attending: Emergency Medicine | Admitting: Emergency Medicine

## 2022-12-03 DIAGNOSIS — E039 Hypothyroidism, unspecified: Secondary | ICD-10-CM | POA: Diagnosis not present

## 2022-12-03 DIAGNOSIS — M545 Low back pain, unspecified: Secondary | ICD-10-CM | POA: Insufficient documentation

## 2022-12-03 DIAGNOSIS — M549 Dorsalgia, unspecified: Secondary | ICD-10-CM | POA: Diagnosis present

## 2022-12-03 LAB — URINALYSIS, ROUTINE W REFLEX MICROSCOPIC
Bilirubin Urine: NEGATIVE
Glucose, UA: NEGATIVE mg/dL
Hgb urine dipstick: NEGATIVE
Ketones, ur: NEGATIVE mg/dL
Nitrite: NEGATIVE
Protein, ur: NEGATIVE mg/dL
Specific Gravity, Urine: 1.018 (ref 1.005–1.030)
pH: 5 (ref 5.0–8.0)

## 2022-12-03 MED ORDER — METHOCARBAMOL 500 MG PO TABS: ORAL_TABLET | ORAL | 0 refills | Status: DC

## 2022-12-03 MED ORDER — LIDOCAINE 5 % EX PTCH: 1.0000 | MEDICATED_PATCH | Freq: Two times a day (BID) | CUTANEOUS | 0 refills | Status: AC

## 2022-12-03 MED ORDER — NAPROXEN 500 MG PO TABS: 500.0000 mg | ORAL_TABLET | Freq: Two times a day (BID) | ORAL | 0 refills | Status: DC

## 2022-12-03 MED ORDER — METHOCARBAMOL 500 MG PO TABS
1000.0000 mg | ORAL_TABLET | Freq: Once | ORAL | Status: AC
  Administered 2022-12-03: 1000 mg via ORAL
  Filled 2022-12-03: qty 2

## 2022-12-03 MED ORDER — OXYCODONE HCL 5 MG PO TABS
5.0000 mg | ORAL_TABLET | Freq: Once | ORAL | Status: AC
  Administered 2022-12-03: 5 mg via ORAL
  Filled 2022-12-03: qty 1

## 2022-12-03 MED ORDER — LIDOCAINE 5 % EX PTCH
1.0000 | MEDICATED_PATCH | CUTANEOUS | Status: DC
  Administered 2022-12-03: 1 via TRANSDERMAL
  Filled 2022-12-03: qty 1

## 2022-12-03 NOTE — ED Provider Notes (Signed)
[facility] Provider Note    Event Date/Time   First MD Initiated Contact with Patient 12/03/22 1546     (approximate)   History   Back Pain (LEFT hip pain that radiates to her back and abdomen x 6 days that has progressively worsened; No relief with OTC medication and Flexeril; States that when the pain comes, she has urinary incontinence but is otherwise able to hold her urine; Denies numbness/tingling to lower extremities; PCP concerned for nerve compression given intermittent incontinence and lower back pain; Planned for MRI but has not been scheduled)   HPI  Molly Sutton is a 61 y.o. female presents to the ED after seeing a provider at [facility] today.  Patient reports that she has had left-sided back pain for approximately 1 week without history of injury.  No radiculopathy to the left lower extremity.  Patient describes her pain as a grabbing sensation with movement.  Patient has been taking her husband's Flexeril and taking over-the-counter medication with minimal improvement.  She was seen by her PCP today and prescribed hydrocodone which she has not picked up from the pharmacy as of yet.  No previous injury to her back or surgeries.  Patient has a history of stress incontinence prior to her back pain "for years" and states that there is no incontinence of bowel or bladder that is different than what she is experienced for years.  No true history of incontinence of bowel or bladder or saddle anesthesias.  She was told by her PCP to come to the emergency department to get an MRI as she may have permanent nerve damage.  Patient has a history of anxiety, depression and hypothyroidism.     Physical Exam   Triage Vital Signs: ED Triage Vitals  Enc Vitals Group     BP 12/03/22 1356 (!) 144/62     Pulse Rate 12/03/22 1407 60     Resp 12/03/22 1356 19     Temp 12/03/22 1412 98.6 F (37 C)     Temp Source 12/03/22 1412 Oral     SpO2 12/03/22 1356 98 %      Weight 12/03/22 1349 255 lb (115.7 kg)     Height 12/03/22 1349 5\' 4"  (1.626 m)     Head Circumference --      Peak Flow --      Pain Score 12/03/22 1349 10     Pain Loc --      Pain Edu? --      Excl. in GC? --     Most recent vital signs: Vitals:   12/03/22 1806 12/03/22 1818  BP: 125/65 128/68  Pulse: 85 80  Resp: 17 16  Temp: 97.8 F (36.6 C) 98 F (36.7 C)  SpO2:  97%     General: Awake, no distress.  Patient appears to be uncomfortable in a wheelchair sitting upright.  Range of motion is noted to increased pain. CV:  Good peripheral perfusion.  Heart regular rate and rhythm. Resp:  Normal effort.  Clear bilaterally. Abd:  No distention.  Soft, nontender. Other:  No point tenderness on palpation of the lumbar spine bony processes or step-offs are noted.  There is moderate tenderness and firmness on palpation of the left vertebral muscles lumbar area.  Patient is able move lower extremities without any difficulty.  Range of motion is difficult to assess due to movement increases her muscle spasms causing her pain.   ED Results / Procedures / Treatments  Labs (all labs ordered are listed, but only abnormal results are displayed) Labs Reviewed  URINALYSIS, ROUTINE W REFLEX MICROSCOPIC - Abnormal; Notable for the following components:      Result Value   Color, Urine YELLOW (*)    APPearance CLOUDY (*)    Leukocytes,Ua TRACE (*)    Bacteria, UA RARE (*)    All other components within normal limits     RADIOLOGY  Number spine x-ray images were reviewed by myself independently and noted to have degenerative changes at multiple levels.  Radiology report officially reads acute findings.  Multilevel facet arthropathy.  MRI lumbar spine radiologist shows facet degenerative changes at L4-L5 and L5-S1.  Spinal stenosis and no evidence to suggest cauda equina.  L3-L4 there is facet joint fluid, nonspecific that can be seen with segmental  instability.  PROCEDURES:  Critical Care performed:   Procedures   MEDICATIONS ORDERED IN ED: Medications  lidocaine (LIDODERM) 5 % 1 patch (has no administration in time range)  oxyCODONE (Oxy IR/ROXICODONE) immediate release tablet 5 mg (5 mg Oral Given 12/03/22 1616)  methocarbamol (ROBAXIN) tablet 1,000 mg (1,000 mg Oral Given 12/03/22 1616)     IMPRESSION / MDM / ASSESSMENT AND PLAN / ED COURSE  I reviewed the triage vital signs and the nursing notes.   Differential diagnosis includes, but is not limited to, lumbar strain, also spasms, muscle skeletal pain, compression fracture, acute low back pain.  No evidence suspicious for cauda equina.  61 year old female presents to the ED with complaint of left-sided low back pain for 1 week.  She was seen by her PCP at [facility] and told to come to the emergency department for an MRI.  Patient while in the emergency department was given oxycodone IR 5 mg, methocarbamol 1000 mg p.o. and began having some improvement of her symptoms after an hour.  She was reassured that her urinalysis did not show any infection and lumbar spine x-ray showed multilevel degenerative changes, however she continued to be anxious about "permanent nerve damage".  Patient was willing to remain in the emergency department for an MRI nonemergent.  She was made aware that her PCP did send a prescription for hydrocodone to her pharmacy.  MRI was ordered.  Results was discussed with patient and also consulted Dr. Myer Haff who is on-call for neurosurgery.  He advised to continue treatment as already started.  Patient is aware that a prescription for hydrocodone already is at her pharmacy per her PCP.  A Lidoderm patch was applied while in the emergency department and a prescription for methocarbamol, Lidoderm patch 5% and naproxen was sent to the pharmacy for her to begin taking also.  She will follow-up as an outpatient with Dr. Myer Haff with his contact information  being given to her.     Patient's presentation is most consistent with acute complicated illness / injury requiring diagnostic workup.  FINAL CLINICAL IMPRESSION(S) / ED DIAGNOSES   Final diagnoses:  Left lumbar pain     Rx / DC Orders   ED Discharge Orders          Ordered    lidocaine (LIDODERM) 5 %  Every 12 hours        12/03/22 1922    methocarbamol (ROBAXIN) 500 MG tablet        12/03/22 1922    naproxen (NAPROSYN) 500 MG tablet  2 times daily with meals        12/03/22 1922  Note:  This document was prepared using Dragon voice recognition software and may include unintentional dictation errors.   Tommi Rumps, PA-C 12/03/22 1958    Arnaldo Natal, MD 12/03/22 [phone]

## 2022-12-03 NOTE — Discharge Instructions (Signed)
Call make an appoint with Dr. Myer Haff who is on-call for neurosurgery.  His office phone number and address are listed on your discharge papers.  Also continue with the hydrocodone that was sent to the pharmacy by your primary care provider.  Prescriptions for Lidoderm patch, methocarbamol 1 or 2 every 6 hours as needed for muscle spasms and naproxen 500 mg twice daily with food was sent to the pharmacy for you to begin taking also.

## 2022-12-03 NOTE — ED Triage Notes (Addendum)
LEFT hip pain that radiates to her back and abdomen x 6 days that has progressively worsened; No relief with OTC medication and Flexeril; States that when the pain comes, she has urinary incontinence but is otherwise able to hold her urine; Denies numbness/tingling to lower extremities; PCP concerned for nerve compression given intermittent incontinence and lower back pain; Planned for MRI but has not been scheduled

## 2022-12-14 NOTE — Progress Notes (Deleted)
Referring Physician:  No referring provider defined for this encounter.  Primary Physician:  Lynnea Ferrier, MD  History of Present Illness: 12/14/2022*** Ms. Rin Gorton has a history of anxiety, depression, and hypothyroidism.   Seen in ED on 12/03/22 for 1 week history of left sided LBP with no leg pain.      History of stress incontinence x years. No new bowel/bladder issues.   Given robaxin, lidoderm patches, and naproxen from ED. PCP had called in hydrocodone.    Duration: *** Location: *** Quality: *** Severity: ***  Precipitating: aggravated by *** Modifying factors: made better by *** Weakness: none Timing: *** Bowel/Bladder Dysfunction: none  Conservative measures:  Physical therapy: ***  Multimodal medical therapy including regular antiinflammatories: robaxin, lidoderm patches, naproxen, hydrocodone  Injections: *** epidural steroid injections  Past Surgery: no spinal surgery  Elvia Aydin has ***no symptoms of cervical myelopathy.  The symptoms are causing a significant impact on the patient's life.   Review of Systems:  A 10 point review of systems is negative, except for the pertinent positives and negatives detailed in the HPI.  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   b complex vitamins capsule Take 1 capsule by mouth daily.   citalopram (CELEXA) 40 MG tablet Take 40 mg by mouth daily.   hydrochlorothiazide (HYDRODIURIL) 12.5 MG tablet Take 12.5 mg by mouth daily.   levothyroxine (SYNTHROID, LEVOTHROID) 88 MCG tablet Take 88 mcg by mouth daily before breakfast.   lidocaine (LIDODERM) 5 % Place 1 patch onto the skin every 12 (twelve) hours. Remove & Discard patch within 12 hours or as directed by MD   methocarbamol (ROBAXIN) 500 MG tablet 1-2 every 6 hours prn muscle spasms   Multiple Vitamin (MULTIVITAMIN) capsule Take 1 capsule by mouth daily.   naproxen (NAPROSYN) 500 MG tablet Take 1 tablet (500 mg total) by mouth 2 (two) times daily with a meal.   No facility-administered encounter medications on file as of 12/16/2022.    Social History: Social History   Tobacco Use   Smoking status: Every Day    Packs/day: 1.00    Years: 30.00    Total pack years: 30.00    Types: Cigarettes   Smokeless tobacco: Never   Tobacco comments:    quit 2 weeks ago  Substance Use Topics   Alcohol use: Yes    Alcohol/week: 14.0 standard drinks of alcohol    Types: 14 Cans of beer per week    Comment: currently 2 beers a night   Drug use: Not Currently    Family Medical History: No family history on file.  Physical Examination: There were no vitals filed for this visit.  General: Patient is well  developed, well nourished, calm, collected, and in no apparent distress. Attention to examination is appropriate.  Respiratory: Patient is breathing without any difficulty.   NEUROLOGICAL:     Awake, alert, oriented to person, place, and time.  Speech is clear and fluent. Fund of knowledge is appropriate.   Cranial Nerves: Pupils equal round and reactive to light.  Facial tone is symmetric.  Facial sensation is symmetric.  ROM of spine:  *** ROM of cervical spine *** pain *** ROM of lumbar spine *** pain  No abnormal lesions on  exposed skin.   Strength: Side Biceps Triceps Deltoid Interossei Grip Wrist Ext. Wrist Flex.  R 5 5 5 5 5 5 5   L 5 5 5 5 5 5 5    Side Iliopsoas Quads Hamstring PF DF EHL  R 5 5 5 5 5 5   L 5 5 5 5 5 5    Reflexes are ***2+ and symmetric at the biceps, triceps, brachioradialis, patella and achilles.   Hoffman's is absent.  Clonus is not present.   Bilateral upper and lower extremity sensation is intact to light touch.    No evidence of dysmetria noted.  Gait is normal.   ***No difficulty with tandem gait.    Medical Decision Making  Imaging: MRI of lumbar spine dated 12/03/22:  FINDINGS: Segmentation: Standard. The last well-formed disc space is labeled L5-S1.   Alignment: Trace retrolisthesis of L1 on L2. Grade 1 anterolisthesis of L5 on S1.   Vertebrae: No fracture, evidence of discitis, or bone lesion. Degenerative endplate changes at the inferior and superior endplates of T12 and L1, as well as L5 and S1. Likely an osseous hemangioma at the right lateral aspect of L2 and L5.   Conus medullaris and cauda equina: Conus extends to the L2 level. Conus and cauda equina appear normal.   Paraspinal and other soft tissues: Negative.   Disc levels:   T11-T12: Only imaged in the sagittal plane. There is a small disc bulge. No evidence of spinal canal stenosis. No neural foraminal stenosis.   T12-L1: Circumferential disc bulge. Mild bilateral facet degenerative change. No significant spinal canal stenosis. No neural foraminal stenosis.   L1-L2: No disc bulge. Mild bilateral facet degenerative change. No spinal canal stenosis. No neural foraminal stenosis.   L2-L3: No significant disc bulge. Mild-to-moderate bilateral facet degenerative change. No spinal canal stenosis. No neural foraminal stenosis.   L3-L4: No disc bulge. Moderate bilateral facet degenerative change with fluid in the facet joints. No spinal canal stenosis. No neural foraminal stenosis.   L4-L5:  Moderate to severe bilateral facet degenerative change. No significant disc bulge. No spinal canal stenosis. Mild right neural foraminal narrowing.   L5-S1: Severe bilateral facet degenerative change. Minimal disc bulge. No spinal canal narrowing. There is narrowing of the right lateral recess. Mild left neural foraminal narrowing.   IMPRESSION: 1. Lower lumbar spine predominant facet degenerative change that is severe at L4-L5 and L5-S1. At L3-L4 there is fluid in the facet joints, which is nonspecific, but can be seen in the setting of segmental instability. 2. No evidence of high-grade spinal canal or neural foraminal stenosis.     Electronically Signed   By: M.D.   On: 12/03/2022 18:58   Xrays of lumbar spine dated 12/03/22:  FINDINGS: Five lumbar type vertebra. No acute fracture or subluxation of the lumbar spine. Multilevel facet arthropathy. The soft tissues are unremarkable.   IMPRESSION: No acute findings. Multilevel facet arthropathy.  Electronically Signed   By: Elgie Collard M.D.   On: 12/03/2022 17:36  I have personally reviewed the images and agree with the above interpretation.  Assessment and Plan: Ms. Puleo is a pleasant 61 y.o. female with ***  Treatment options discussed with patient and following plan made:   - Order for physical therapy for *** spine ***. - Continue on current medications including ***. Reviewed proper dosing along with risks and benefits. Take and NSAIDs with food.      I spent a total of *** minutes in face-to-face and non-face-to-face activities related to this patient's care today including review of outside records, review of imaging, review of symptoms, physical exam, discussion of differential diagnosis, discussion of treatment options, and documentation.   Thank you for involving me in the care of this patient.   Drake Leach PA-C Dept. of Neurosurgery

## 2022-12-16 ENCOUNTER — Ambulatory Visit: Payer: 59 | Admitting: Orthopedic Surgery

## 2023-03-11 NOTE — Progress Notes (Deleted)
Referring Physician:  Adin Hector, MD [facility] [place],  [place] [address]  Primary Physician:  Adin Hector, MD  History of Present Illness: 03/11/2023*** [name] has a history of thyroid disease, B12 deficiency, hyperlipidemia, obesity, CAD.   Seen in ED on 12/03/22 with left sided LBP and she is here for follow up.     She was given lidoderm patches, robaxin, and naproxen from the ED.    Duration: *** Location: *** Quality: *** Severity: ***  Precipitating: aggravated by *** Modifying factors: made better by *** Weakness: none Timing: *** Bowel/Bladder Dysfunction: none*** History of stress incontinence x years.   Conservative measures:  Physical therapy: ***  Multimodal medical therapy including regular antiinflammatories: lidoderm patches, naproxen, robaxin,   Injections: *** epidural steroid injections  Past Surgery: ***  Molly Sutton has ***no symptoms of cervical myelopathy.  The symptoms are causing a significant impact on the patient's life.   Review of Systems:  A 10 point review of systems is negative, except for the pertinent positives and negatives detailed in the HPI.  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   b complex vitamins capsule Take 1 capsule by mouth daily.   citalopram (CELEXA) 40 MG tablet Take 40 mg by mouth daily.   hydrochlorothiazide (HYDRODIURIL) 12.5 MG tablet Take 12.5 mg by mouth daily.   levothyroxine (SYNTHROID, LEVOTHROID) 88 MCG tablet Take 88 mcg by mouth daily before breakfast.   lidocaine (LIDODERM) 5 % Place 1 patch onto the skin every 12 (twelve) hours. Remove & Discard patch within 12 hours or as directed by MD   methocarbamol (ROBAXIN) 500 MG tablet 1-2 every 6 hours prn muscle spasms   Multiple Vitamin (MULTIVITAMIN) capsule Take 1 capsule by mouth daily.   naproxen (NAPROSYN) 500 MG tablet Take 1 tablet (500 mg total) by mouth 2 (two) times daily with a meal.   No facility-administered encounter medications on file as of 03/15/2023.    Social History: Social History   Tobacco Use   Smoking status: Every Day    Packs/day: 1.00    Years: 30.00    Additional pack years: 0.00    Total pack years: 30.00    Types: Cigarettes   Smokeless tobacco: Never   Tobacco comments:    quit 2 weeks ago  Substance Use Topics   Alcohol use: Yes    Alcohol/week: 14.0 standard drinks of alcohol    Types: 14 Cans of beer per week    Comment: currently 2 beers a night   Drug use: Not Currently    Family Medical History: No family history on file.  Physical Examination: There were no vitals filed  for this visit.  General: Patient is well developed, well nourished, calm, collected, and in no apparent distress. Attention to examination is appropriate.  Respiratory: Patient is breathing without any difficulty.   NEUROLOGICAL:     Awake, alert, oriented to person, place, and time.  Speech is clear and fluent. Fund of knowledge is appropriate.   Cranial Nerves: Pupils equal round and reactive to light.  Facial tone is symmetric.    *** ROM of cervical spine *** pain *** posterior cervical tenderness. *** tenderness in bilateral trapezial region.   *** ROM of  lumbar spine *** pain *** posterior lumbar tenderness.   No abnormal lesions on exposed skin.   Strength: Side Biceps Triceps Deltoid Interossei Grip Wrist Ext. Wrist Flex.  R '5 5 5 5 5 5 5  '$ L '5 5 5 5 5 5 5   '$ Side Iliopsoas Quads Hamstring PF DF EHL  R '5 5 5 5 5 5  '$ L '5 5 5 5 5 5   '$ Reflexes are ***2+ and symmetric at the biceps, triceps, brachioradialis, patella and achilles.   Hoffman's is absent.  Clonus is not present.   Bilateral upper and lower extremity sensation is intact to light touch.     Gait is normal.   ***No difficulty with tandem gait.    Medical Decision Making  Imaging: Lumbar xrays dated 12/03/22:  FINDINGS: Five lumbar type vertebra. No acute fracture or subluxation of the lumbar spine. Multilevel facet arthropathy. The soft tissues are unremarkable.   IMPRESSION: No acute findings. Multilevel facet arthropathy.     Electronically Signed   By: Anner Crete M.D.   On: 12/03/2022 17:36   Lumbar MRI dated 12/03/22:  FINDINGS: Segmentation: Standard. The last well-formed disc space is labeled L5-S1.   Alignment: Trace retrolisthesis of L1 on L2. Grade 1 anterolisthesis of L5 on S1.   Vertebrae: No fracture, evidence of discitis, or bone lesion. Degenerative endplate changes at the inferior and superior endplates of 624THL and L1, as well as L5 and S1. Likely an osseous hemangioma at the right lateral aspect of L2 and L5.   Conus medullaris and cauda equina: Conus extends to the L2 level. Conus and cauda equina appear normal.   Paraspinal and other soft tissues: Negative.   Disc levels:   T11-T12: Only imaged in the sagittal plane. There is a small disc bulge. No evidence of spinal canal stenosis. No neural foraminal stenosis.   T12-L1: Circumferential disc bulge. Mild bilateral facet degenerative change. No significant spinal canal stenosis. No neural foraminal stenosis.   L1-L2: No disc bulge. Mild bilateral facet degenerative change.  No spinal canal stenosis. No neural foraminal stenosis.   L2-L3: No significant disc bulge. Mild-to-moderate bilateral facet degenerative change. No spinal canal stenosis. No neural foraminal stenosis.   L3-L4: No disc bulge. Moderate bilateral facet degenerative change with fluid in the facet joints. No spinal canal stenosis. No neural foraminal stenosis.   L4-L5: Moderate to severe bilateral facet degenerative change. No significant disc bulge. No spinal canal stenosis. Mild right neural foraminal narrowing.   L5-S1: Severe bilateral facet degenerative change. Minimal disc bulge. No spinal canal narrowing. There is narrowing of the right lateral recess. Mild left neural foraminal narrowing.   IMPRESSION: 1. Lower lumbar spine predominant facet degenerative change that is severe at L4-L5 and L5-S1. At L3-L4 there is fluid in the facet joints, which is nonspecific, but can be seen in the setting of segmental instability. 2. No evidence of high-grade spinal  canal or neural foraminal stenosis.     Electronically Signed   By: Marin Roberts M.D.   On: 12/03/2022 18:58  I have personally reviewed the images and agree with the above interpretation. On xrays, she has slip at L3-L4 and L5-S1.    Assessment and Plan: Ms. Comolli is a pleasant 62 y.o. female has ***  Treatment options discussed with patient and following plan made:   - Order for physical therapy for *** spine ***. Patient to call to schedule appointment. *** - Continue current medications including ***. Reviewed dosing and side effects.  - Prescription for ***. Reviewed dosing and side effects. Take with food.  - Prescription for *** to take prn muscle spasms. Reviewed dosing and side effects. Discussed this can cause drowsiness.  - MRI of *** to further evaluate *** radiculopathy. No improvement time or medications (***).  - Referral to PMR at [facility] to discuss possible *** injections.  - Will schedule phone visit to  review MRI results once I get them back.   I spent a total of *** minutes in face-to-face and non-face-to-face activities related to this patient's care today including review of outside records, review of imaging, review of symptoms, physical exam, discussion of differential diagnosis, discussion of treatment options, and documentation.   Thank you for involving me in the care of this patient.   Geronimo Boot PA-C Dept. of Neurosurgery

## 2023-03-15 ENCOUNTER — Ambulatory Visit: Payer: Self-pay | Admitting: Orthopedic Surgery

## 2023-08-13 ENCOUNTER — Other Ambulatory Visit: Payer: Self-pay | Admitting: Internal Medicine

## 2023-08-13 DIAGNOSIS — Z1231 Encounter for screening mammogram for malignant neoplasm of breast: Secondary | ICD-10-CM

## 2023-08-25 ENCOUNTER — Ambulatory Visit
Admission: RE | Admit: 2023-08-25 | Discharge: 2023-08-25 | Disposition: A | Discharge status: Home / Self Care | Payer: 59 | Source: Ambulatory Visit | Attending: Internal Medicine | Admitting: Internal Medicine

## 2023-08-25 DIAGNOSIS — Z1231 Encounter for screening mammogram for malignant neoplasm of breast: Secondary | ICD-10-CM | POA: Insufficient documentation

## 2023-09-01 ENCOUNTER — Other Ambulatory Visit: Payer: Self-pay | Admitting: Internal Medicine

## 2023-09-01 DIAGNOSIS — R928 Other abnormal and inconclusive findings on diagnostic imaging of breast: Secondary | ICD-10-CM

## 2023-09-02 ENCOUNTER — Ambulatory Visit
Admission: RE | Admit: 2023-09-02 | Discharge: 2023-09-02 | Disposition: A | Discharge status: Home / Self Care | Payer: 59 | Source: Ambulatory Visit | Attending: Internal Medicine | Admitting: Internal Medicine

## 2023-09-02 DIAGNOSIS — R928 Other abnormal and inconclusive findings on diagnostic imaging of breast: Secondary | ICD-10-CM | POA: Diagnosis present

## 2024-04-29 ENCOUNTER — Other Ambulatory Visit: Payer: Self-pay

## 2024-04-29 ENCOUNTER — Observation Stay
Admission: EM | Admit: 2024-04-29 | Discharge: 2024-04-30 | Disposition: A | Discharge status: Home / Self Care | Attending: Internal Medicine | Admitting: Internal Medicine

## 2024-04-29 ENCOUNTER — Emergency Department

## 2024-04-29 DIAGNOSIS — R001 Bradycardia, unspecified: Secondary | ICD-10-CM | POA: Diagnosis not present

## 2024-04-29 DIAGNOSIS — R7989 Other specified abnormal findings of blood chemistry: Secondary | ICD-10-CM | POA: Insufficient documentation

## 2024-04-29 DIAGNOSIS — E039 Hypothyroidism, unspecified: Secondary | ICD-10-CM | POA: Insufficient documentation

## 2024-04-29 DIAGNOSIS — I2510 Atherosclerotic heart disease of native coronary artery without angina pectoris: Secondary | ICD-10-CM | POA: Insufficient documentation

## 2024-04-29 DIAGNOSIS — Z79899 Other long term (current) drug therapy: Secondary | ICD-10-CM | POA: Diagnosis not present

## 2024-04-29 DIAGNOSIS — E66813 Obesity, class 3: Secondary | ICD-10-CM | POA: Insufficient documentation

## 2024-04-29 DIAGNOSIS — R531 Weakness: Secondary | ICD-10-CM | POA: Diagnosis present

## 2024-04-29 DIAGNOSIS — Z6841 Body Mass Index (BMI) 40.0 and over, adult: Secondary | ICD-10-CM | POA: Diagnosis not present

## 2024-04-29 DIAGNOSIS — G4733 Obstructive sleep apnea (adult) (pediatric): Secondary | ICD-10-CM | POA: Insufficient documentation

## 2024-04-29 DIAGNOSIS — Z7901 Long term (current) use of anticoagulants: Secondary | ICD-10-CM | POA: Insufficient documentation

## 2024-04-29 LAB — TSH: TSH: 0.875 u[IU]/mL (ref 0.350–4.500)

## 2024-04-29 LAB — TROPONIN I (HIGH SENSITIVITY)
Troponin I (High Sensitivity): 42 ng/L — ABNORMAL HIGH (ref <18)
Troponin I (High Sensitivity): 33 ng/L — ABNORMAL HIGH (ref <18)
Troponin I (High Sensitivity): 35 ng/L — ABNORMAL HIGH (ref <18)
Troponin I (High Sensitivity): 43 ng/L — ABNORMAL HIGH (ref <18)

## 2024-04-29 LAB — CBC
HCT: 42.3 % (ref 36.0–46.0)
Hemoglobin: 13.7 g/dL (ref 12.0–15.0)
MCH: 29.8 pg (ref 26.0–34.0)
MCHC: 32.4 g/dL (ref 30.0–36.0)
MCV: 92 fL (ref 80.0–100.0)
Platelets: 246 10*3/uL (ref 150–400)
RBC: 4.6 MIL/uL (ref 3.87–5.11)
RDW: 13.8 % (ref 11.5–15.5)
WBC: 8.4 10*3/uL (ref 4.0–10.5)
nRBC: 0 % (ref 0.0–0.2)

## 2024-04-29 LAB — BRAIN NATRIURETIC PEPTIDE: B Natriuretic Peptide: 254.5 pg/mL — ABNORMAL HIGH (ref 0.0–100.0)

## 2024-04-29 LAB — MAGNESIUM: Magnesium: 1.8 mg/dL (ref 1.7–2.4)

## 2024-04-29 LAB — BASIC METABOLIC PANEL WITH GFR
Anion gap: 11 (ref 5–15)
BUN: 11 mg/dL (ref 8–23)
CO2: 21 mmol/L — ABNORMAL LOW (ref 22–32)
Calcium: 9.2 mg/dL (ref 8.9–10.3)
Chloride: 107 mmol/L (ref 98–111)
Creatinine, Ser: 0.71 mg/dL (ref 0.44–1.00)
GFR, Estimated: >60 mL/min (ref >60)
Glucose, Bld: 93 mg/dL (ref 70–99)
Potassium: 4 mmol/L (ref 3.5–5.1)
Sodium: 139 mmol/L (ref 135–145)

## 2024-04-29 LAB — HIV ANTIBODY (ROUTINE TESTING W REFLEX): HIV Screen 4th Generation wRfx: NONREACTIVE

## 2024-04-29 MED ORDER — ASPIRIN 81 MG PO CHEW
CHEWABLE_TABLET | ORAL | Status: AC
  Filled 2024-04-29: qty 1

## 2024-04-29 MED ORDER — IPRATROPIUM-ALBUTEROL 0.5-2.5 (3) MG/3ML IN SOLN: 3.0000 mL | RESPIRATORY_TRACT | Status: DC | PRN

## 2024-04-29 MED ORDER — ENOXAPARIN SODIUM 40 MG/0.4ML IJ SOSY
40.0000 mg | PREFILLED_SYRINGE | INTRAMUSCULAR | Status: DC
  Administered 2024-04-29: 40 mg via SUBCUTANEOUS
  Filled 2024-04-29: qty 0.4

## 2024-04-29 MED ORDER — FUROSEMIDE 10 MG/ML IJ SOLN
20.0000 mg | Freq: Once | INTRAMUSCULAR | Status: AC
  Administered 2024-04-29: 20 mg via INTRAVENOUS
  Filled 2024-04-29: qty 4

## 2024-04-29 MED ORDER — FUROSEMIDE 10 MG/ML IJ SOLN: 40.0000 mg | Freq: Once | INTRAMUSCULAR | Status: DC

## 2024-04-29 MED ORDER — TRAZODONE HCL 50 MG PO TABS: 50.0000 mg | ORAL_TABLET | Freq: Every evening | ORAL | Status: DC | PRN

## 2024-04-29 MED ORDER — CITALOPRAM HYDROBROMIDE 20 MG PO TABS
40.0000 mg | ORAL_TABLET | Freq: Every day | ORAL | Status: DC
  Administered 2024-04-30: 40 mg via ORAL
  Filled 2024-04-29: qty 2

## 2024-04-29 MED ORDER — ASPIRIN 81 MG PO TBEC
81.0000 mg | DELAYED_RELEASE_TABLET | Freq: Every day | ORAL | Status: DC
  Filled 2024-04-29: qty 1

## 2024-04-29 MED ORDER — ASPIRIN 325 MG PO TBEC
DELAYED_RELEASE_TABLET | ORAL | Status: AC
Start: 2024-04-29 — End: 2024-04-29
  Administered 2024-04-29: 325 mg via ORAL
  Filled 2024-04-29: qty 1

## 2024-04-29 MED ORDER — AMOXICILLIN 500 MG PO CAPS
500.0000 mg | ORAL_CAPSULE | Freq: Three times a day (TID) | ORAL | Status: DC
  Filled 2024-04-29 (×2): qty 1

## 2024-04-29 MED ORDER — IBUPROFEN 400 MG PO TABS
600.0000 mg | ORAL_TABLET | Freq: Four times a day (QID) | ORAL | Status: DC | PRN
  Administered 2024-04-29: 600 mg via ORAL
  Filled 2024-04-29: qty 2

## 2024-04-29 MED ORDER — ASPIRIN 81 MG PO CHEW
324.0000 mg | CHEWABLE_TABLET | Freq: Once | ORAL | Status: AC
  Administered 2024-04-29: 324 mg via ORAL
  Filled 2024-04-29: qty 4

## 2024-04-29 MED ORDER — LEVOTHYROXINE SODIUM 88 MCG PO TABS
88.0000 ug | ORAL_TABLET | Freq: Every day | ORAL | Status: DC
  Administered 2024-04-30: 88 ug via ORAL
  Filled 2024-04-29: qty 1

## 2024-04-29 NOTE — Significant Event (Incomplete)
       CROSS COVER NOTE  NAME: Molly Sutton MRN: [id number] DOB : Aug 01, 1961 ATTENDING PHYSICIAN: Jodeane Mulligan, DO    Date of Service   04/29/2024   HPI/Events of Note   C/o chest pain more like pressure, transient then had tinglieng down left arm also transient  Interventions   Assessment/Plan: EKG SB PACs NO STE Troponin trend thus far flat   Latest Reference Range & Units 04/29/24 09:47 04/29/24 11:58 04/29/24 14:29  Troponin I (High Sensitivity) <18 ng/L 42 (H) 33 (H) 35 (H)  (H): D  Latest Reference Range & Units 04/29/24 09:47  B Natriuretic Peptide 0.0 - 100.0 pg/mL 254.5 (H)  (H): Data is abnormally highata is abnormally high  Chest xray today without overt  pulmonary edema; thoracic spondylosis CT LUNG 2021 small right 2.5 mm pulmonary nodule; recommended f/u CT in 1 year  30 pack year smoking history - everyday smoker cigarettest Abn mammogram with f/u ultrasound right breast 08/2023 IMPRESSION: Benign mildly ectatic ducts in the medial posterior right breast.  RECOMMENDATION: screening mammogram 1 year    Bedside patient endorses shortness of breath with ambulation daily and daily swelling of legs, currently appears mildly pitting at ankle and non pitting in calves  X X X

## 2024-04-29 NOTE — H&P (Signed)
 History and Physical    Patient: Molly Sutton [id number] DOB: [birth year] DOA: 04/29/2024 DOS: the patient was seen and examined on 04/29/2024 PCP: Melchor Spoon, MD  Patient coming from: Home  Chief Complaint:  Chief Complaint  Patient presents with   Weakness    HPI: Molly Sutton is a 63 y.o. female with history of anxiety/depression, OSA, obesity, hypothyroidism, CAD, bradycardia, presenting with worsening dizziness and dyspnea on exertion.  Patient states that for the last couple weeks she has felt intermittently dizzy with lower than normal heart rate measured on portable pulse ox device.  Claims heart rates down to the 30s, usually in the 50s-70s.  Stated that she has had worsening dyspnea on exertion, even tasks like taking a shower or walking around the room because her to stop and sit down.  Has intermittent lower extremity pitting edema.  Denies any episodes of chest pain or pressure, diaphoresis, sweating, nausea, vomiting, abdominal pain.  No recent illness, fevers, cough, purulence.  Does not take any cardiac medications, nothing over-the-counter.  Patient had a cardiac catheterization 2020 which was normal.  Last seen by [facility] cardiology in the outpatient setting in 2022.  On evaluation emergency department heart rates noted in the 40s-50s, troponin 42 then 33.  ECG showing normal sinus rhythm with bradycardia, some bigeminy.  Review of Systems: As mentioned in the history of present illness. All other systems reviewed and are negative. 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 She has a 30 pack-year smoking history. She has never used smokeless tobacco. She reports current alcohol use of about 14.0 standard drinks of alcohol per week. She reports that she does not currently use drugs.  No Known Allergies  Family History  Problem Relation Age of Onset   Breast cancer Neg Hx     Prior to Admission medications   Medication Sig Start Date End Date Taking? Authorizing Provider  aspirin  EC 81 MG tablet Take 81 mg by mouth daily.    [provider]  b complex vitamins capsule Take 1 capsule by mouth daily.    [provider]  citalopram (CELEXA) 40 MG tablet Take 40 mg by mouth daily.    [provider]  hydrochlorothiazide (HYDRODIURIL) 12.5 MG tablet Take 12.5 mg by mouth daily.    [provider]  levothyroxine (SYNTHROID, LEVOTHROID) 88 MCG tablet Take 88 mcg by mouth daily before breakfast.    [provider]  methocarbamol  (ROBAXIN ) 500 MG tablet 1-2 every 6 hours prn muscle spasms 12/03/22   Stafford Eagles, PA-C  Multiple Vitamin (MULTIVITAMIN) capsule Take 1 capsule by mouth daily.    [provider]  naproxen  (NAPROSYN ) 500 MG tablet Take 1 tablet (500 mg total) by mouth 2 (two) times daily with a meal. 12/03/22   Stafford Eagles, PA-C    Physical Exam:  Vitals:   04/29/24 1000 04/29/24 1100 04/29/24 1130 04/29/24 1200  BP: (!) 170/70 (!) 137/54 (!) 158/76 (!) 162/69  Pulse: 63 (!) 57 (!) 48 (!) 25  Resp: 10 19 14 16   Temp:      TempSrc:      SpO2: 99% 95% 95% 98%    GENERAL:  Alert, pleasant, no acute distress  HEENT:  EOMI CARDIOVASCULAR: Regular bradycardic RESPIRATORY:  Clear to auscultation, no wheezing, rales, or rhonchi GASTROINTESTINAL:  Soft, nontender, nondistended EXTREMITIES: Trace LE edema bilaterally NEURO:  No new focal deficits appreciated SKIN:   No rashes noted PSYCH:  Appropriate mood and affect    Data Reviewed:  ECG personally reviewed showing normal sinus rhythm with bradycardia.  Assessment and Plan:  Symptomatic bradycardia - Currently has rates in the 40s however largely asymptomatic at this time.  No indication for atropine or pacing.  Intermittent recorded rates in the 30s per patient with symptoms of CHF given lower extremity edema as well as dyspnea on exertion.  Mild troponin elevation as well suggesting demand ischemia.  Will order BNP, TSH, echo.  Placed on telemetry.  Will consult cardiology and appreciate their input.  Patient states she has had bradycardia with rates in the 50s for most of her life.  Never been symptomatic nor had rates in the 30s before.  CAD - Continue aspirin .  Patient has discontinued statins on her own belief.  History of cardiac cath 2020 that showed normal coronaries and normal EF.  Reported abnormal functional study Myoview showed possible evidence of ischemia (date of test unclear).  Recommendation at that time to pursue conservative medical management unless symptoms worsen.  Elevated troponin - Mild troponin elevation but flat/downtrending suggestive of likely demand ischemia secondary to above.  Aspirin  on board.  Will trend troponins.  Hypothyroidism - Will order TSH.  Obesity class III - BMI 43.  Encourage weight loss.   Obstructive sleep apnea - Will attempt CPAP at night.     Advance Care Planning:   Code Status: Full Code   Consults: Cardiology  Family Communication: Family at bedside  Severity of Illness: The appropriate patient status for this patient is OBSERVATION. Observation status is judged to be reasonable and necessary in order to provide the required intensity of service to ensure the patient's safety. The patient's presenting symptoms, physical exam findings, and initial radiographic and laboratory data in the context of their medical condition is felt to  place them at decreased risk for further clinical deterioration. Furthermore, it is anticipated that the patient will be medically stable for discharge from the hospital within 2 midnights of admission.   Author: Jodeane Mulligan, DO 04/29/2024 1:42 PM  For on call review [email or web address].

## 2024-04-29 NOTE — Consult Note (Signed)
 CARDIOLOGY CONSULT NOTE               Patient ID: Molly Sutton MRN: [id number] DOB/AGE: August 21, 1961 63 y.o.  Admit date: 04/29/2024 Referring Physician Dr. Pandora Bogaert hospitalist Primary Physician Dr. Eddy Goodell primary Primary Cardiologist [facility] Reason for Consultation bradycardia fatigue  HPI: Patient is a 63 year old obese white female presents with generalized fatigue significant bradycardia reportedly had intermittent rates in the 30s mostly in 40s.  Denies any blackout spells or syncope she is just complaining of generalized fatigue lack of energy occasional shortness of breath .  Denied any evaluation for possible obstructive sleep apnea has never had a sleep study had a limited cardiac workup about 5 years ago including a benign cath with no significant coronary disease preserved left ventricular function.  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     Past Surgical History:  Procedure Laterality Date   ABDOMINAL HYSTERECTOMY     LEFT HEART CATH AND CORONARY ANGIOGRAPHY Left 01/26/2019   Procedure: LEFT HEART CATH AND CORONARY ANGIOGRAPHY;  Surgeon: Antonette Batters, MD;  Location: [facility] INVASIVE CV LAB;  Service: Cardiovascular;  Laterality: Left;   REPLACEMENT TOTAL KNEE     TONSILLECTOMY      (Not in a hospital admission)  Social History   Socioeconomic History   Marital status: Married    Spouse name: Not on file   Number of children: Not on file   Years of education: Not on file   Highest education level: Not on file   Occupational History   Not on file  Tobacco Use   Smoking status: Every Day    Current packs/day: 1.00    Average packs/day: 1 pack/day for 30.0 years (30.0 ttl pk-yrs)    Types: Cigarettes   Smokeless tobacco: Never   Tobacco comments:    quit 2 weeks ago  Substance and Sexual Activity   Alcohol use: Yes    Alcohol/week: 14.0 standard drinks of alcohol    Types: 14 Cans of beer per week    Comment: currently 2 beers a night   Drug use: Not Currently   Sexual activity: Not on file  Other Topics Concern   Not on file  Social History Narrative   Not on file   Social Drivers of Health   Financial Resource Strain: Low Risk  (08/13/2023)   Received from [facility]   Overall Financial Resource Strain (CARDIA)    Difficulty of Paying Living Expenses: Not hard at all  Food Insecurity: No Food Insecurity (08/13/2023)   Received from [facility]   Hunger Vital Sign    Worried About Running Out of Food in the Last Year: Never true    Ran Out of Food in the Last Year: Never true  Transportation Needs: No Transportation Needs (08/13/2023)   Received from [facility] - Transportation    In the past 12 months, has lack of transportation kept you from  medical appointments or from getting medications?: No    Lack of Transportation (Non-Medical): No  Physical Activity: Not on file  Stress: Not on file  Social Connections: Not on file  Intimate Partner Violence: Not on file    Family History  Problem Relation Age of Onset   Breast cancer Neg Hx       Review of systems complete and found to be negative unless listed above      PHYSICAL EXAM  General: Well developed, well nourished, in no acute distress HEENT:  Normocephalic and atramatic Neck:  No JVD.  Lungs: Clear bilaterally to auscultation and percussion. Heart: HRRR . Normal S1 and S2 without gallops or murmurs.  Abdomen: Bowel sounds are positive, abdomen  soft and non-tender  Msk:  Back normal, normal gait. Normal strength and tone for age. Extremities: No clubbing, cyanosis or edema.   Neuro: Alert and oriented X 3. Psych:  Good affect, responds appropriately  Labs:   Lab Results  Component Value Date   WBC 8.4 04/29/2024   HGB 13.7 04/29/2024   HCT 42.3 04/29/2024   MCV 92.0 04/29/2024   PLT 246 04/29/2024    Recent Labs  Lab 04/29/24 0947  NA 139  K 4.0  CL 107  CO2 21*  BUN 11  CREATININE 0.71  CALCIUM 9.2  GLUCOSE 93   Lab Results  Component Value Date   TROPONINI <0.03 11/01/2016   No results found for: "CHOL" No results found for: "HDL" No results found for: "LDLCALC" No results found for: "TRIG" No results found for: "CHOLHDL" No results found for: "LDLDIRECT"    Radiology: DG Chest 2 View Result Date: 04/29/2024 CLINICAL DATA:  Chest pain. EXAM: CHEST - 2 VIEW COMPARISON:  07/17/2020 FINDINGS: Heart size and mediastinal contours appear normal. Opacity within the medial right base corresponds with prominent CP angle fat pad as noted on prior CT of the chest. No signs of pleural effusion, interstitial edema or airspace disease. Visualized osseous structures are notable for mild multilevel thoracic spondylosis. IMPRESSION: No acute cardiopulmonary abnormalities. Electronically Signed   By: Kimberley Penman M.D.   On: 04/29/2024 10:25    EKG: Sinus bradycardia nonspecific ST-T wave changes  ASSESSMENT AND PLAN:  Bradycardia Obesity Generalized fatigue Hyperlipidemia Thyroid disease Tooth abscess Former smoker Alcohol use Probable obstructive sleep apnea . Plan Agree with admit to telemetry Follow-up EKGs troponin and telemetry Chronic bradycardia but no clear indication for permanent pacemaker Hypertension continue current therapy Echocardiogram to help with left ventricular function wall motion and valvular structures Obesity recommend significant weight loss exercise portion control Recommend sleep  study for probable obstructive sleep apnea CPAP if indicated Advised patient to refrain from or reduce alcohol consumption Consider functional study on treadmill to evaluate chronotropic competence Continue DVT prophylaxis   Signed: Antonette Batters MD 04/29/2024, 1:50 PM

## 2024-04-29 NOTE — ED Triage Notes (Signed)
 Pt to ED from home c/o chest pain, SOB, and ankle edema with nausea and dizzinessfor the last 2 days. Pt checked her HR this morning and HR was 32 with elevated BP. Pt is A&O x4 in triage and speaking in full sentences.    Hx CAD

## 2024-04-29 NOTE — Hospital Course (Addendum)
 63 y.o. female with history of anxiety/depression, OSA, obesity, hypothyroidism, CAD, bradycardia, presenting with worsening dizziness and dyspnea on exertion.    Assessment and Plan:   Symptomatic bradycardia - Currently has rates in the 40s however largely asymptomatic at this time.  No indication for atropine or pacing.  Intermittent recorded rates in the 30s per patient with symptoms of CHF given lower extremity edema as well as dyspnea on exertion.  Mild troponin elevation as well suggesting demand ischemia.  Will order BNP, TSH, echo.  Placed on telemetry.  Will consult cardiology and appreciate their input.  Patient states she has had bradycardia with rates in the 50s for most of her life.  Never been symptomatic nor had rates in the 30s before.   CAD - Continue aspirin .  Patient has discontinued statins on her own belief.  History of cardiac cath 2020 that showed normal coronaries and normal EF.  Reported abnormal functional study Myoview showed possible evidence of ischemia (date of test unclear).  Recommendation at that time to pursue conservative medical management unless symptoms worsen.   Elevated troponin - Mild troponin elevation but flat/downtrending suggestive of likely demand ischemia secondary to above.  Aspirin  on board.  Will trend troponins.   Hypothyroidism - Will order TSH.   Obesity class III - BMI 43.  Encourage weight loss.   Obstructive sleep apnea - Will attempt CPAP at night.

## 2024-04-29 NOTE — Plan of Care (Signed)
 Patient complaining of "chest pain" but says only Pressure, not pain.  Stat EKG completed and placed in chart.  ASA given, vitals taken and Hospitalist notified and rounded. Trop ordered and drawn, 20 lasix orders and pushed.

## 2024-04-29 NOTE — ED Provider Notes (Signed)
 [facility] Provider Note    Event Date/Time   First MD Initiated Contact with Patient 04/29/24 [phone]     (approximate)   History   Weakness   HPI  Molly Sutton is a 63 y.o. female presents to the emergency department with weakness.  Patient states that she started to not feel well this past Thursday.  Was evaluated at her dentist and diagnosed with a dental abscess.  States that she had her dental abscess and tooth removed on Friday.  When she was at the dentist she was noted to have a heart rate that was in the 30s.  States that her heart rate is normally in the 50s.  Does endorse exertional dyspnea and only able to walk a couple of steps across the room before she gets short winded.  Denies any cough.  Denies new medications or change in medications.  Denies prior stress testing or cardiac catheterization.     Physical Exam   Triage Vital Signs: ED Triage Vitals  Encounter Vitals Group     BP 04/29/24 0945 (!) 149/128     Systolic BP Percentile --      Diastolic BP Percentile --      Pulse Rate 04/29/24 0945 (!) 46     Resp 04/29/24 0945 18     Temp 04/29/24 0945 98.4 F (36.9 C)     Temp Source 04/29/24 0945 Oral     SpO2 04/29/24 0945 97 %     Weight --      Height --      Head Circumference --      Peak Flow --      Pain Score 04/29/24 0946 8     Pain Loc --      Pain Education --      Exclude from Growth Chart --     Most recent vital signs: Vitals:   04/29/24 1430 04/29/24 1520  BP: (!) 156/62   Pulse: (!) 59   Resp: 17   Temp:  97.9 F (36.6 C)  SpO2: 96%     Physical Exam Constitutional:      Appearance: She is well-developed.  HENT:     Head: Atraumatic.  Eyes:     Conjunctiva/sclera: Conjunctivae normal.  Cardiovascular:     Rate and Rhythm: Regular rhythm. Bradycardia present.     Heart sounds: No murmur heard. Pulmonary:     Effort: No respiratory distress.  Abdominal:     General: There is no distension.   Musculoskeletal:        General: Normal range of motion.     Cervical back: Normal range of motion.     Right lower leg: No edema.     Left lower leg: No edema.  Skin:    General: Skin is warm.     Capillary Refill: Capillary refill takes less than 2 seconds.  Neurological:     Mental Status: She is alert. Mental status is at baseline.     IMPRESSION / MDM / ASSESSMENT AND PLAN / ED COURSE  I reviewed the triage vital signs and the nursing notes.  Differential diagnosis including dysrhythmia, ACS, electrolyte abnormality  EKG  I, Viviano Ground, the attending physician, personally viewed and interpreted this ECG. Sinus bradycardia with no findings of a heart block.  Normal intervals.  No significant ST elevation or depression.  No findings of acute ischemia.  Sinus bradycardia while on cardiac telemetry.  RADIOLOGY I independently reviewed imaging, my  interpretation of imaging: Chest x-ray no signs of pneumonia no widened mediastinum.  LABS (all labs ordered are listed, but only abnormal results are displayed) Labs interpreted as -    Labs Reviewed  BASIC METABOLIC PANEL WITH GFR - Abnormal; Notable for the following components:      Result Value   CO2 21 (*)    All other components within normal limits  BRAIN NATRIURETIC PEPTIDE - Abnormal; Notable for the following components:   B Natriuretic Peptide 254.5 (*)    All other components within normal limits  TROPONIN I (HIGH SENSITIVITY) - Abnormal; Notable for the following components:   Troponin I (High Sensitivity) 42 (*)    All other components within normal limits  TROPONIN I (HIGH SENSITIVITY) - Abnormal; Notable for the following components:   Troponin I (High Sensitivity) 33 (*)    All other components within normal limits  TROPONIN I (HIGH SENSITIVITY) - Abnormal; Notable for the following components:   Troponin I (High Sensitivity) 35 (*)    All other components within normal limits  CBC  MAGNESIUM  TSH   HIV ANTIBODY (ROUTINE TESTING W REFLEX)     MDM    Electrolytes reassuring.  Initial troponin is elevated at 42.  No prior troponin to compare.  Given her exertional dyspnea and new worsening bradycardia given aspirin  and recommended admission for further evaluation by cardiology.  Repeat EKG without findings of a heart block.  Remained sinus bradycardia while in the emergency department.  Care discussed with hospitalist who will talk with cardiology.   PROCEDURES:  Critical Care performed: No  Procedures  Patient's presentation is most consistent with acute presentation with potential threat to life or bodily function.   MEDICATIONS ORDERED IN ED: Medications  aspirin  EC tablet 81 mg (has no administration in time range)  citalopram (CELEXA) tablet 40 mg (has no administration in time range)  levothyroxine (SYNTHROID) tablet 88 mcg (has no administration in time range)  enoxaparin (LOVENOX) injection 40 mg (has no administration in time range)  aspirin  chewable tablet 324 mg (324 mg Oral Given 04/29/24 1053)    FINAL CLINICAL IMPRESSION(S) / ED DIAGNOSES   Final diagnoses:  Bradycardia     Rx / DC Orders   ED Discharge Orders     None        Note:  This document was prepared using Dragon voice recognition software and may include unintentional dictation errors.   Viviano Ground, MD 04/29/24 [phone]

## 2024-04-30 ENCOUNTER — Observation Stay
Admit: 2024-04-30 | Discharge: 2024-04-30 | Disposition: A | Discharge status: Home / Self Care | Attending: Internal Medicine | Admitting: Internal Medicine

## 2024-04-30 ENCOUNTER — Observation Stay

## 2024-04-30 DIAGNOSIS — R001 Bradycardia, unspecified: Secondary | ICD-10-CM | POA: Diagnosis not present

## 2024-04-30 DIAGNOSIS — G4733 Obstructive sleep apnea (adult) (pediatric): Secondary | ICD-10-CM | POA: Diagnosis not present

## 2024-04-30 DIAGNOSIS — I2510 Atherosclerotic heart disease of native coronary artery without angina pectoris: Secondary | ICD-10-CM | POA: Diagnosis not present

## 2024-04-30 LAB — BASIC METABOLIC PANEL WITH GFR
Anion gap: 8 (ref 5–15)
BUN: 12 mg/dL (ref 8–23)
CO2: 25 mmol/L (ref 22–32)
Calcium: 8.8 mg/dL — ABNORMAL LOW (ref 8.9–10.3)
Chloride: 106 mmol/L (ref 98–111)
Creatinine, Ser: 0.72 mg/dL (ref 0.44–1.00)
GFR, Estimated: >60 mL/min (ref >60)
Glucose, Bld: 98 mg/dL (ref 70–99)
Potassium: 3.7 mmol/L (ref 3.5–5.1)
Sodium: 139 mmol/L (ref 135–145)

## 2024-04-30 LAB — TROPONIN I (HIGH SENSITIVITY): Troponin I (High Sensitivity): 43 ng/L — ABNORMAL HIGH (ref <18)

## 2024-04-30 LAB — CBC
HCT: 38.1 % (ref 36.0–46.0)
Hemoglobin: 12.9 g/dL (ref 12.0–15.0)
MCH: 30.5 pg (ref 26.0–34.0)
MCHC: 33.9 g/dL (ref 30.0–36.0)
MCV: 90.1 fL (ref 80.0–100.0)
Platelets: 216 10*3/uL (ref 150–400)
RBC: 4.23 MIL/uL (ref 3.87–5.11)
RDW: 13.8 % (ref 11.5–15.5)
WBC: 9 10*3/uL (ref 4.0–10.5)
nRBC: 0 % (ref 0.0–0.2)

## 2024-04-30 LAB — MAGNESIUM: Magnesium: 1.7 mg/dL (ref 1.7–2.4)

## 2024-04-30 LAB — D-DIMER, QUANTITATIVE: D-Dimer, Quant: 0.32 ug{FEU}/mL (ref 0.00–0.50)

## 2024-04-30 MED ORDER — ASPIRIN 81 MG PO TBEC: 81.0000 mg | DELAYED_RELEASE_TABLET | Freq: Every day | ORAL | 12 refills | Status: DC

## 2024-04-30 NOTE — Discharge Summary (Signed)
 Physician Discharge Summary   Patient: Molly Sutton MRN: [id number] DOB: 05/18/1961  Admit date:     04/29/2024  Discharge date: 04/30/24  Discharge Physician: Jodeane Mulligan   PCP: Melchor Spoon, MD   Recommendations at discharge:    Pt to be discharged home.   If you experience worsening fever, chills, chest pain, shortness of breath, or other concerning symptoms, please call your PCP or go to the emergency department immediately.  Discharge Diagnoses: Principal Problem:   Symptomatic sinus bradycardia Active Problems:   Coronary artery disease involving native heart without angina pectoris   OSA (obstructive sleep apnea)  Resolved Problems:   * No resolved hospital problems. *   Hospital Course:  63 y.o. female with history of anxiety/depression, OSA, obesity, hypothyroidism, CAD, bradycardia, presenting with worsening dizziness and dyspnea on exertion.    Assessment and Plan:   Symptomatic bradycardia - Currently has rates in the 40s however largely asymptomatic at this time.  No indication for atropine or pacing.  Intermittent recorded rates in the 30s per patient with symptoms of CHF given lower extremity edema as well as dyspnea on exertion.  Mild troponin elevation as well suggesting demand ischemia.  Minimally elevated BNP, normal TSH.  Sinus bradycardia on telemetry.  Cardiology consulted.  No indication for pacemaker placement at this time.  Patient vitals stable.  Recommendations from cardiology to follow-up for outpatient Holter monitor evaluation.  Possible treadmill inotropic compensation in the outpatient as well.  Will place referral to outpatient Dr. Gollan as requested by patient.  CAD - Continue aspirin .  Patient has discontinued statins on her own belief.  History of cardiac cath 2020 that showed normal coronaries and normal EF.  Reported abnormal functional study Myoview showed possible evidence of ischemia (date of test unclear).  Recommendation at that  time to pursue conservative medical management unless symptoms worsen.  No intervention to do at this time.  Will follow with cardiology in the outpatient setting.  Recommend initiating aspirin  81 mg daily.   Elevated troponin - Mild troponin elevation but flat/downtrending suggestive of likely demand ischemia secondary to above.  Aspirin  on board.     Hypothyroidism - TSH normal.  Resume levothyroxine home regimen.   Obesity class III - BMI 43.  Encourage weight loss.   Obstructive sleep apnea - Encouraged sleep study and CPAP.  May be contributing to patient's bradycardia.  Consultants: Cardiology Procedures performed: None Disposition: Home Diet recommendation:  Discharge Diet Orders (From admission, onward)     Start     Ordered   04/30/24 0000  Diet - low sodium heart healthy        04/30/24 1107           Cardiac diet  DISCHARGE MEDICATION: Allergies as of 04/30/2024   No Known Allergies      Medication List     STOP taking these medications    multivitamin capsule       TAKE these medications    amoxicillin 500 MG tablet Commonly known as: AMOXIL Take 500 mg by mouth 3 (three) times daily.   aspirin  EC 81 MG tablet Take 1 tablet (81 mg total) by mouth daily. Swallow whole. Start taking on: May 01, 2024 What changed: additional instructions   b complex vitamins capsule Take 1 capsule by mouth daily.   cholecalciferol 25 MCG (1000 UNIT) tablet Commonly known as: VITAMIN D3 Take 1,000 Units by mouth daily.   citalopram 40 MG tablet Commonly known as: CELEXA  Take 40 mg by mouth daily.   levothyroxine 88 MCG tablet Commonly known as: SYNTHROID Take 88 mcg by mouth daily before breakfast.         Discharge Exam: There were no vitals filed for this visit.  GENERAL:  Alert, pleasant, no acute distress  HEENT:  EOMI CARDIOVASCULAR: Regular bradycardic RESPIRATORY:  Clear to auscultation, no wheezing, rales, or rhonchi GASTROINTESTINAL:   Soft, nontender, nondistended EXTREMITIES: Trace LE edema bilaterally NEURO:  No new focal deficits appreciated SKIN:  No rashes noted PSYCH:  Appropriate mood and affect    Condition at discharge: improving  The results of significant diagnostics from this hospitalization (including imaging, microbiology, ancillary and laboratory) are listed below for reference.   Imaging Studies: CT CHEST WO CONTRAST Result Date: 04/30/2024 CLINICAL DATA:  Pulmonary nodule. Shortness of breath and chest pain. EXAM: CT CHEST WITHOUT CONTRAST TECHNIQUE: Multidetector CT imaging of the chest was performed following the standard protocol without IV contrast. RADIATION DOSE REDUCTION: This exam was performed according to the departmental dose-optimization program which includes automated exposure control, adjustment of the mA and/or kV according to patient size and/or use of iterative reconstruction technique. COMPARISON:  Chest x-ray 04/29/2024. Lung cancer screening chest CT 07/21/2021z FINDINGS: Cardiovascular: The heart size is normal. No substantial pericardial effusion. Coronary artery calcification is evident. Mild atherosclerotic calcification is noted in the wall of the thoracic aorta. Mediastinum/Nodes: No mediastinal lymphadenopathy. No evidence for gross hilar lymphadenopathy although assessment is limited by the lack of intravenous contrast on the current study. The esophagus has normal imaging features. There is no axillary lymphadenopathy. Lungs/Pleura: No suspicious pulmonary nodule or mass. No focal airspace consolidation. No pleural effusion. Upper Abdomen: Visualized portion of the upper abdomen shows no acute findings. Tiny hyperattenuating subcapsular focus in the upper interpolar left kidney is compatible with a proteinaceous or hemorrhagic cyst. Musculoskeletal: No worrisome lytic or sclerotic osseous abnormality. IMPRESSION: 1. No suspicious pulmonary nodule or mass. 2. No acute findings in the  chest. 3.  Aortic Atherosclerosis (ICD10-I70.0). Electronically Signed   By: Donnal Fusi M.D.   On: 04/30/2024 07:43   DG Chest 2 View Result Date: 04/29/2024 CLINICAL DATA:  Chest pain. EXAM: CHEST - 2 VIEW COMPARISON:  07/17/2020 FINDINGS: Heart size and mediastinal contours appear normal. Opacity within the medial right base corresponds with prominent CP angle fat pad as noted on prior CT of the chest. No signs of pleural effusion, interstitial edema or airspace disease. Visualized osseous structures are notable for mild multilevel thoracic spondylosis. IMPRESSION: No acute cardiopulmonary abnormalities. Electronically Signed   By: Kimberley Penman M.D.   On: 04/29/2024 10:25    Microbiology: No results found for this or any previous visit.  Labs: CBC: Recent Labs  Lab 04/29/24 0947 04/30/24 0136  WBC 8.4 9.0  HGB 13.7 12.9  HCT 42.3 38.1  MCV 92.0 90.1  PLT 246 216   Basic Metabolic Panel: Recent Labs  Lab 04/29/24 0947 04/30/24 0136  NA 139 139  K 4.0 3.7  CL 107 106  CO2 21* 25  GLUCOSE 93 98  BUN 11 12  CREATININE 0.71 0.72  CALCIUM 9.2 8.8*  MG 1.8 1.7   Liver Function Tests: No results for input(s): "AST", "ALT", "ALKPHOS", "BILITOT", "PROT", "ALBUMIN" in the last 168 hours. CBG: No results for input(s): "GLUCAP" in the last 168 hours.  Discharge time spent: 38 minutes.  Signed: Jodeane Mulligan, DO [facility] 04/30/2024

## 2024-04-30 NOTE — ED Notes (Signed)
 pt is insisting upon being given AMA paperwork at this time. per the pt she "is not understanding why she needs all of these other tests" and states "it's only adding to my bill". Dr. Macarthur Savory MD alerted to this update.

## 2024-04-30 NOTE — ED Notes (Signed)
 This RN received report from Rolm Clos RN and performed care handoff. This RN introduced self to pt. Call light in reach, bed wheels locked, side rails raised, pt updated on plan of care. Rounding completed.

## 2024-04-30 NOTE — Progress Notes (Signed)
 [facility] Cardiology  SUBJECTIVE: Patient walking around in room, denies chest pain or shortness of breath   Vitals:   04/30/24 0310 04/30/24 0535 04/30/24 0605 04/30/24 0700  BP: (!) 157/61  (!) 146/64 (!) 135/50  Pulse: 62  (!) 33 (!) 44  Resp: 20  16   Temp:  98 F (36.7 C)    TempSrc:  Oral    SpO2: 91%  95% 92%     Intake/Output Summary (Last 24 hours) at 04/30/2024 0932 Last data filed at 04/30/2024 0540 Gross per 24 hour  Intake 30 ml  Output 500 ml  Net -470 ml      PHYSICAL EXAM  General: Well developed, well nourished, in no acute distress HEENT:  Normocephalic and atramatic Neck:  No JVD.  Lungs: Clear bilaterally to auscultation and percussion. Heart: HRRR . Normal S1 and S2 without gallops or murmurs.  Abdomen: Bowel sounds are positive, abdomen soft and non-tender  Msk:  Back normal, normal gait. Normal strength and tone for age. Extremities: No clubbing, cyanosis or edema.   Neuro: Alert and oriented X 3. Psych:  Good affect, responds appropriately   LABS: Basic Metabolic Panel: Recent Labs    04/29/24 0947 04/30/24 0136  NA 139 139  K 4.0 3.7  CL 107 106  CO2 21* 25  GLUCOSE 93 98  BUN 11 12  CREATININE 0.71 0.72  CALCIUM 9.2 8.8*  MG 1.8 1.7   Liver Function Tests: No results for input(s): "AST", "ALT", "ALKPHOS", "BILITOT", "PROT", "ALBUMIN" in the last 72 hours. No results for input(s): "LIPASE", "AMYLASE" in the last 72 hours. CBC: Recent Labs    04/29/24 0947 04/30/24 0136  WBC 8.4 9.0  HGB 13.7 12.9  HCT 42.3 38.1  MCV 92.0 90.1  PLT 246 216   Cardiac Enzymes: No results for input(s): "CKTOTAL", "CKMB", "CKMBINDEX", "TROPONINI" in the last 72 hours. BNP: Invalid input(s): "POCBNP" D-Dimer: Recent Labs    04/29/24 2343  DDIMER 0.32   Hemoglobin A1C: No results for input(s): "HGBA1C" in the last 72 hours. Fasting Lipid Panel: No results for input(s): "CHOL", "HDL", "LDLCALC", "TRIG", "CHOLHDL", "LDLDIRECT" in the last 72  hours. Thyroid Function Tests: Recent Labs    04/29/24 0947  TSH 0.875   Anemia Panel: No results for input(s): "VITAMINB12", "FOLATE", "FERRITIN", "TIBC", "IRON", "RETICCTPCT" in the last 72 hours.  CT CHEST WO CONTRAST Result Date: 04/30/2024 CLINICAL DATA:  Pulmonary nodule. Shortness of breath and chest pain. EXAM: CT CHEST WITHOUT CONTRAST TECHNIQUE: Multidetector CT imaging of the chest was performed following the standard protocol without IV contrast. RADIATION DOSE REDUCTION: This exam was performed according to the departmental dose-optimization program which includes automated exposure control, adjustment of the mA and/or kV according to patient size and/or use of iterative reconstruction technique. COMPARISON:  Chest x-ray 04/29/2024. Lung cancer screening chest CT 07/21/2021z FINDINGS: Cardiovascular: The heart size is normal. No substantial pericardial effusion. Coronary artery calcification is evident. Mild atherosclerotic calcification is noted in the wall of the thoracic aorta. Mediastinum/Nodes: No mediastinal lymphadenopathy. No evidence for gross hilar lymphadenopathy although assessment is limited by the lack of intravenous contrast on the current study. The esophagus has normal imaging features. There is no axillary lymphadenopathy. Lungs/Pleura: No suspicious pulmonary nodule or mass. No focal airspace consolidation. No pleural effusion. Upper Abdomen: Visualized portion of the upper abdomen shows no acute findings. Tiny hyperattenuating subcapsular focus in the upper interpolar left kidney is compatible with a proteinaceous or hemorrhagic cyst. Musculoskeletal: No worrisome lytic  or sclerotic osseous abnormality. IMPRESSION: 1. No suspicious pulmonary nodule or mass. 2. No acute findings in the chest. 3.  Aortic Atherosclerosis (ICD10-I70.0). Electronically Signed   By: Donnal Fusi M.D.   On: 04/30/2024 07:43   DG Chest 2 View Result Date: 04/29/2024 CLINICAL DATA:  Chest pain.  EXAM: CHEST - 2 VIEW COMPARISON:  07/17/2020 FINDINGS: Heart size and mediastinal contours appear normal. Opacity within the medial right base corresponds with prominent CP angle fat pad as noted on prior CT of the chest. No signs of pleural effusion, interstitial edema or airspace disease. Visualized osseous structures are notable for mild multilevel thoracic spondylosis. IMPRESSION: No acute cardiopulmonary abnormalities. Electronically Signed   By: Kimberley Penman M.D.   On: 04/29/2024 10:25     Echo pending  TELEMETRY: Sinus rhythm 63 bpm:  ASSESSMENT AND PLAN:  Principal Problem:   Symptomatic sinus bradycardia Active Problems:   Coronary artery disease involving native heart without angina pectoris   OSA (obstructive sleep apnea)    1.  Sinus bradycardia, chronic, present at least several years, currently in sinus rhythm at 63 bpm 2.  Generalized fatigue  Recommendations  1.  No indication for urgent pacemaker at this time 2.  May discharge home, continue outpatient workup, including 2D echocardiogram, ETT, sleep study, and Holter monitor.  Patient wishes to continue follow-up as outpatient with Dr. Gollan.   Percival Brace, MD, PhD, [facility] 04/30/2024 9:32 AM

## 2024-05-01 LAB — ALPHA-1-ANTITRYPSIN: A-1 Antitrypsin, Ser: 129 mg/dL (ref 101–187)

## 2024-05-31 DIAGNOSIS — F17211 Nicotine dependence, cigarettes, in remission: Secondary | ICD-10-CM | POA: Diagnosis not present

## 2024-05-31 DIAGNOSIS — I2510 Atherosclerotic heart disease of native coronary artery without angina pectoris: Secondary | ICD-10-CM | POA: Diagnosis not present

## 2024-05-31 DIAGNOSIS — Z6837 Body mass index (BMI) 37.0-37.9, adult: Secondary | ICD-10-CM | POA: Diagnosis not present

## 2024-05-31 DIAGNOSIS — E785 Hyperlipidemia, unspecified: Secondary | ICD-10-CM | POA: Diagnosis not present

## 2024-05-31 DIAGNOSIS — E039 Hypothyroidism, unspecified: Secondary | ICD-10-CM | POA: Diagnosis not present

## 2024-05-31 DIAGNOSIS — Z008 Encounter for other general examination: Secondary | ICD-10-CM | POA: Diagnosis not present

## 2024-07-06 DIAGNOSIS — C44712 Basal cell carcinoma of skin of right lower limb, including hip: Secondary | ICD-10-CM | POA: Diagnosis not present

## 2024-07-31 DIAGNOSIS — L821 Other seborrheic keratosis: Secondary | ICD-10-CM | POA: Diagnosis not present

## 2024-07-31 DIAGNOSIS — B078 Other viral warts: Secondary | ICD-10-CM | POA: Diagnosis not present

## 2024-07-31 DIAGNOSIS — R238 Other skin changes: Secondary | ICD-10-CM | POA: Diagnosis not present

## 2024-07-31 DIAGNOSIS — D2271 Melanocytic nevi of right lower limb, including hip: Secondary | ICD-10-CM | POA: Diagnosis not present

## 2024-07-31 DIAGNOSIS — D2272 Melanocytic nevi of left lower limb, including hip: Secondary | ICD-10-CM | POA: Diagnosis not present

## 2024-07-31 DIAGNOSIS — L2989 Other pruritus: Secondary | ICD-10-CM | POA: Diagnosis not present

## 2024-07-31 DIAGNOSIS — D2262 Melanocytic nevi of left upper limb, including shoulder: Secondary | ICD-10-CM | POA: Diagnosis not present

## 2024-07-31 DIAGNOSIS — D2261 Melanocytic nevi of right upper limb, including shoulder: Secondary | ICD-10-CM | POA: Diagnosis not present

## 2024-07-31 DIAGNOSIS — D225 Melanocytic nevi of trunk: Secondary | ICD-10-CM | POA: Diagnosis not present

## 2024-08-07 DIAGNOSIS — I2510 Atherosclerotic heart disease of native coronary artery without angina pectoris: Secondary | ICD-10-CM | POA: Diagnosis not present

## 2024-08-07 DIAGNOSIS — E7849 Other hyperlipidemia: Secondary | ICD-10-CM | POA: Diagnosis not present

## 2024-08-07 DIAGNOSIS — E538 Deficiency of other specified B group vitamins: Secondary | ICD-10-CM | POA: Diagnosis not present

## 2024-08-07 DIAGNOSIS — I700 Atherosclerosis of aorta: Secondary | ICD-10-CM | POA: Diagnosis not present

## 2024-08-14 DIAGNOSIS — E538 Deficiency of other specified B group vitamins: Secondary | ICD-10-CM | POA: Diagnosis not present

## 2024-08-14 DIAGNOSIS — E079 Disorder of thyroid, unspecified: Secondary | ICD-10-CM | POA: Diagnosis not present

## 2024-08-14 DIAGNOSIS — E7849 Other hyperlipidemia: Secondary | ICD-10-CM | POA: Diagnosis not present

## 2024-08-14 DIAGNOSIS — M519 Unspecified thoracic, thoracolumbar and lumbosacral intervertebral disc disorder: Secondary | ICD-10-CM | POA: Diagnosis not present

## 2024-08-14 DIAGNOSIS — I2510 Atherosclerotic heart disease of native coronary artery without angina pectoris: Secondary | ICD-10-CM | POA: Diagnosis not present

## 2024-08-14 DIAGNOSIS — I700 Atherosclerosis of aorta: Secondary | ICD-10-CM | POA: Diagnosis not present

## 2024-08-14 DIAGNOSIS — R001 Bradycardia, unspecified: Secondary | ICD-10-CM | POA: Diagnosis not present

## 2024-08-14 DIAGNOSIS — Z1331 Encounter for screening for depression: Secondary | ICD-10-CM | POA: Diagnosis not present

## 2024-08-14 DIAGNOSIS — Z0000 Encounter for general adult medical examination without abnormal findings: Secondary | ICD-10-CM | POA: Diagnosis not present

## 2024-08-14 DIAGNOSIS — Z131 Encounter for screening for diabetes mellitus: Secondary | ICD-10-CM | POA: Diagnosis not present

## 2024-08-14 DIAGNOSIS — Z8742 Personal history of other diseases of the female genital tract: Secondary | ICD-10-CM | POA: Diagnosis not present

## 2024-09-20 DIAGNOSIS — N898 Other specified noninflammatory disorders of vagina: Secondary | ICD-10-CM | POA: Diagnosis not present

## 2024-10-06 DIAGNOSIS — E785 Hyperlipidemia, unspecified: Secondary | ICD-10-CM | POA: Insufficient documentation

## 2024-10-06 NOTE — Progress Notes (Signed)
 Cardiology Office Note  Date:  10/09/2024   ID:  Molly Sutton, Molly Sutton 27-Feb-1961, MRN [id number]  PCP:  Fernande Ophelia JINNY DOUGLAS, MD   Chief Complaint  Patient presents with   New Patient (Initial Visit)    Ref Dr. Ophelia Fernande to establish care for bradycardia. Former Dr. Florencio patient at [facility]. Patient was at [facility] in May 2025 with bradycardia.  Denies chest pain or shortness of breath.     HPI:  Molly Sutton is a 63 y.o. female with past medical history of: Hyperlipidemia Obesity Hypothyroidism Former smoker, quit >101yrs ago Former ETOH Who presents by referral from Dr. Adeline Fernande for bradycardia, coronary calcification on CT  CT chest May 2025 Images pulled up and reviewed on her visit today Mild aorta plaque in the arch Not much in the descending aorta carotids or subclavian's -Very mild coronary calcification in the proximal LAD, minimal noted in left circumflex or RCA  Lab work reviewed Total cholesterol 264 LDL 167 off Lipitor  Previously on Lipitor but self discontinued Total cholesterol 176 on Lipitor, LDL 84  Seen in the emergency room May 2025 for bradycardia, rates in the 40s Reports that she is asymptomatic from her bradycardia Rate today 56, blood pressure stable Denies near-syncope or syncope episodes  Was restarted on Lipitor after seeing primary care August 14, 2024  Down 30 pounds, SOB better Reports that she quit smoking around 5 years ago Quit alcohol  August 2025  Family hx: Father: CAD, CABG Mother: COPD, smoker  EKG personally reviewed by myself on todays visit EKG Interpretation Date/Time:  Monday October 09 2024 10:21:33 EDT Ventricular Rate:  56 PR Interval:  168 QRS Duration:  82 QT Interval:  422 QTC Calculation: 407 R Axis:   0  Text Interpretation: Sinus bradycardia Septal infarct (cited on or before 29-Apr-2024) When compared with ECG of 29-Apr-2024 20:25, QRS axis Shifted left Criteria for Lateral infarct are no longer  Present T wave inversion no longer evident in Lateral leads Confirmed by Molly Sutton [phone]) on 10/09/2024 10:28:04 AM    PMH:   has a past medical history of Anxiety, Depression, Thyroid  disease, and 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  PSH:    Past Surgical History:  Procedure Laterality Date   ABDOMINAL HYSTERECTOMY     LEFT HEART CATH AND CORONARY ANGIOGRAPHY Left 01/26/2019   Procedure: LEFT HEART CATH AND CORONARY ANGIOGRAPHY;  Surgeon: Molly Sutton Cara BIRCH, MD;  Location: [facility] INVASIVE CV LAB;  Service: Cardiovascular;  Laterality: Left;   REPLACEMENT TOTAL KNEE     TONSILLECTOMY      Current Outpatient Medications  Medication Sig Dispense Refill   aspirin  EC 81 MG tablet Take 1 tablet (81 mg total) by mouth daily. Swallow whole. 30 tablet 12   atorvastatin (LIPITOR) 40 MG tablet Take 40 mg by mouth at bedtime.     b complex vitamins capsule Take 1 capsule by mouth daily.     cholecalciferol (VITAMIN D3) 25 MCG (1000 UNIT) tablet Take 1,000 Units by mouth daily.     citalopram  (CELEXA ) 40 MG tablet Take 40 mg by mouth daily.     levothyroxine  (SYNTHROID , LEVOTHROID) 88 MCG tablet Take 88 mcg by mouth daily before breakfast.     MILK THISTLE PO Take by mouth daily.     amoxicillin  (AMOXIL ) 500 MG tablet Take 500 mg by mouth 3 (three) times daily. (Patient not taking: Reported on 10/09/2024)     No current facility-administered medications for  this visit.   Allergies:   Patient has no known allergies.   Social History:  The patient  reports that she has quit smoking. Her smoking use included cigarettes. She has a 30 pack-year smoking history. She has never used smokeless tobacco. She reports that she does not currently use alcohol  after a past usage of about 14.0 standard drinks of alcohol  per week. She reports that she does not currently use drugs.   Family History:   family history includes Heart attack in her father; Heart disease in her father.    Review  of Systems: Review of Systems  Constitutional: Negative.   HENT: Negative.    Respiratory: Negative.    Cardiovascular: Negative.   Gastrointestinal: Negative.   Musculoskeletal: Negative.   Neurological: Negative.   Psychiatric/Behavioral: Negative.    All other systems reviewed and are negative.   PHYSICAL EXAM: VS:  BP 122/62 (BP Location: Right Arm, Patient Position: Sitting, Cuff Size: Normal)   Pulse (!) 56   Ht 5' 6 (1.676 m)   Wt 234 lb 6 oz (106.3 kg)   SpO2 97%   BMI 37.83 kg/m  , BMI Body mass index is 37.83 kg/m. GEN: Well nourished, well developed, in no acute distress HEENT: normal Neck: no JVD, carotid bruits, or masses Cardiac: RRR; no murmurs, rubs, or gallops,no edema  Respiratory:  clear to auscultation bilaterally, normal work of breathing GI: soft, nontender, nondistended, + BS MS: no deformity or atrophy Skin: warm and dry, no rash Neuro:  Strength and sensation are intact Psych: euthymic mood, full affect  Recent Labs: 04/29/2024: B Natriuretic Peptide 254.5; TSH 0.875 04/30/2024: BUN 12; Creatinine, Ser 0.72; Hemoglobin 12.9; Magnesium 1.7; Platelets 216; Potassium 3.7; Sodium 139    Lipid Panel No results found for: CHOL, HDL, LDLCALC, TRIG    Wt Readings from Last 3 Encounters:  10/09/24 234 lb 6 oz (106.3 kg)  12/03/22 255 lb (115.7 kg)  07/17/20 223 lb (101.2 kg)     ASSESSMENT AND PLAN:  Problem List Items Addressed This Visit       Cardiology Problems   Hyperlipidemia - Primary   Relevant Medications   atorvastatin (LIPITOR) 40 MG tablet   Symptomatic sinus bradycardia   Relevant Medications   atorvastatin (LIPITOR) 40 MG tablet   Other Relevant Orders   EKG 12-Lead (Completed)   Coronary artery disease involving native heart without angina pectoris   Relevant Medications   atorvastatin (LIPITOR) 40 MG tablet   Other Relevant Orders   EKG 12-Lead (Completed)     Other   OSA (obstructive sleep apnea)   Coronary  calcification on CT scan Very mild in the proximal LAD, not in other vessels -She is currently on Lipitor 40 daily, thinks that she will stay on the medication for now - Discussed other options including injections Repatha versus Leqvio - She prefers no additional medications at this time, we will hold off on Zetia  Aortic atherosclerosis Same plan as above, mild in the arch, not really in other regions of the aorta Cholesterol management as above She is hoping for the least number of medications possible Recommend she stay on the Lipitor for now With her weight loss would like to get numbers lower  Hyperlipidemia Happy to stay on Lipitor 40 now, will hold off on Zetia -She reports weight is trending downward which should help her numbers Minimal plaque detailed above, so generally not a rush to get numbers down  Bradycardia Asymptomatic, blood pressure stable, no  further workup needed Recommend she call for episodes of near syncope or syncope    Signed, Velinda Lunger, M.D., Ph.D. [facility] [place], [place] [phone]

## 2024-10-09 ENCOUNTER — Ambulatory Visit: Attending: Cardiovascular Disease | Admitting: Cardiovascular Disease

## 2024-10-09 ENCOUNTER — Encounter: Payer: Self-pay | Admitting: Cardiovascular Disease

## 2024-10-09 VITALS — BP 122/62 | HR 56 | Ht 66.0 in | Wt 234.4 lb

## 2024-10-09 DIAGNOSIS — G4733 Obstructive sleep apnea (adult) (pediatric): Secondary | ICD-10-CM | POA: Diagnosis not present

## 2024-10-09 DIAGNOSIS — R001 Bradycardia, unspecified: Secondary | ICD-10-CM

## 2024-10-09 DIAGNOSIS — I700 Atherosclerosis of aorta: Secondary | ICD-10-CM | POA: Diagnosis not present

## 2024-10-09 DIAGNOSIS — I2510 Atherosclerotic heart disease of native coronary artery without angina pectoris: Secondary | ICD-10-CM

## 2024-10-09 DIAGNOSIS — E782 Mixed hyperlipidemia: Secondary | ICD-10-CM | POA: Diagnosis not present

## 2024-10-09 NOTE — Patient Instructions (Addendum)
 Medication Instructions:   Ok to hold aspirin   If you need a refill on your cardiac medications before your next appointment, please call your pharmacy.   Lab work: No new labs needed  Testing/Procedures: No new testing needed  Follow-Up: At [facility], you and your health needs are our priority.  As part of our continuing mission to provide you with exceptional heart care, we have created designated Provider Care Teams.  These Care Teams include your primary Cardiologist (physician) and Advanced Practice Providers (APPs -  Physician Assistants and Nurse Practitioners) who all work together to provide you with the care you need, when you need it.  You will need a follow up appointment as needed  Providers on your designated Care Team:   Lonni Meager, NP Bernardino Bring, PA-C Cadence [place], [place]  COVID-19 Vaccine Information can be found at: [email or web address] For questions related to vaccine distribution or appointments, please email [email or web address] or call [phone].
